# Patient Record
Sex: Female | Born: 1975 | Race: Black or African American | Hispanic: No | Marital: Single | Smoking: Never smoker
Health system: Southern US, Community
[De-identification: ages and names within clinical notes are randomized; demographics above are authoritative.]

## PROBLEM LIST (undated history)

## (undated) DIAGNOSIS — J45909 Unspecified asthma, uncomplicated: Secondary | ICD-10-CM

## (undated) DIAGNOSIS — N926 Irregular menstruation, unspecified: Secondary | ICD-10-CM

## (undated) DIAGNOSIS — D509 Iron deficiency anemia, unspecified: Secondary | ICD-10-CM

## (undated) HISTORY — PX: UTERINE FIBROID SURGERY: SHX826

---

## 2011-09-18 ENCOUNTER — Ambulatory Visit: Payer: Self-pay | Admitting: Internal Medicine

## 2012-11-23 ENCOUNTER — Ambulatory Visit: Payer: Self-pay | Admitting: Obstetrics and Gynecology

## 2014-05-30 ENCOUNTER — Emergency Department: Payer: Self-pay | Admitting: Emergency Medicine

## 2015-04-09 ENCOUNTER — Encounter: Payer: Self-pay | Admitting: Emergency Medicine

## 2015-04-09 ENCOUNTER — Emergency Department: Payer: BC Managed Care – PPO

## 2015-04-09 ENCOUNTER — Emergency Department
Admission: EM | Admit: 2015-04-09 | Discharge: 2015-04-09 | Disposition: A | Discharge status: Home / Self Care | Payer: BC Managed Care – PPO | Attending: Emergency Medicine | Admitting: Emergency Medicine

## 2015-04-09 ENCOUNTER — Other Ambulatory Visit: Payer: Self-pay

## 2015-04-09 DIAGNOSIS — M5410 Radiculopathy, site unspecified: Secondary | ICD-10-CM | POA: Diagnosis not present

## 2015-04-09 DIAGNOSIS — R202 Paresthesia of skin: Secondary | ICD-10-CM | POA: Diagnosis present

## 2015-04-09 DIAGNOSIS — Z880 Allergy status to penicillin: Secondary | ICD-10-CM | POA: Insufficient documentation

## 2015-04-09 DIAGNOSIS — R079 Chest pain, unspecified: Secondary | ICD-10-CM

## 2015-04-09 LAB — TROPONIN I

## 2015-04-09 LAB — URINALYSIS COMPLETE WITH MICROSCOPIC (ARMC ONLY)
Bilirubin Urine: NEGATIVE
Glucose, UA: NEGATIVE mg/dL
Ketones, ur: NEGATIVE mg/dL
Leukocytes, UA: NEGATIVE
NITRITE: NEGATIVE
Protein, ur: NEGATIVE mg/dL
Specific Gravity, Urine: 1.004 — ABNORMAL LOW (ref 1.005–1.030)
WBC, UA: NONE SEEN WBC/hpf (ref 0–5)
pH: 8 (ref 5.0–8.0)

## 2015-04-09 LAB — BASIC METABOLIC PANEL
ANION GAP: 6 (ref 5–15)
BUN: 8 mg/dL (ref 6–20)
CO2: 27 mmol/L (ref 22–32)
CREATININE: 0.65 mg/dL (ref 0.44–1.00)
Calcium: 9.2 mg/dL (ref 8.9–10.3)
Chloride: 105 mmol/L (ref 101–111)
GFR calc non Af Amer: >60 mL/min (ref >60)
GLUCOSE: 82 mg/dL (ref 65–99)
Potassium: 3.9 mmol/L (ref 3.5–5.1)
Sodium: 138 mmol/L (ref 135–145)

## 2015-04-09 LAB — CBC
HCT: 37.1 % (ref 35.0–47.0)
HEMOGLOBIN: 11.9 g/dL — AB (ref 12.0–16.0)
MCH: 25.6 pg — ABNORMAL LOW (ref 26.0–34.0)
MCHC: 32.1 g/dL (ref 32.0–36.0)
MCV: 79.8 fL — AB (ref 80.0–100.0)
PLATELETS: 195 10*3/uL (ref 150–440)
RBC: 4.64 MIL/uL (ref 3.80–5.20)
RDW: 14.5 % (ref 11.5–14.5)
WBC: 6 10*3/uL (ref 3.6–11.0)

## 2015-04-09 MED ORDER — DIAZEPAM 5 MG PO TABS: 5.0000 mg | ORAL_TABLET | Freq: Three times a day (TID) | ORAL | Status: DC | PRN

## 2015-04-09 NOTE — ED Notes (Signed)
Has had intermittent chest pain for 2 weeks also has had some tingling to right hand off and on.Marland Kitchen

## 2015-04-09 NOTE — Discharge Instructions (Signed)
Paresthesia Paresthesia is an abnormal burning or prickling sensation. This sensation is generally felt in the hands, arms, legs, or feet. However, it may occur in any part of the body. It is usually not painful. The feeling may be described as:  Tingling or numbness.  "Pins and needles."  Skin crawling.  Buzzing.  Limbs "falling asleep."  Itching. Most people experience temporary (transient) paresthesia at some time in their lives. CAUSES  Paresthesia may occur when you breathe too quickly (hyperventilation). It can also occur without any apparent cause. Commonly, paresthesia occurs when pressure is placed on a nerve. The feeling quickly goes away once the pressure is removed. For some people, however, paresthesia is a long-lasting (chronic) condition caused by an underlying disorder. The underlying disorder may be:  A traumatic, direct injury to nerves. Examples include a:  Broken (fractured) neck.  Fractured skull.  A disorder affecting the brain and spinal cord (central nervous system). Examples include:  Transverse myelitis.  Encephalitis.  Transient ischemic attack.  Multiple sclerosis.  Stroke.  Tumor or blood vessel problems, such as an arteriovenous malformation pressing against the brain or spinal cord.  A condition that damages the peripheral nerves (peripheral neuropathy). Peripheral nerves are not part of the brain and spinal cord. These conditions include:  Diabetes.  Peripheral vascular disease.  Nerve entrapment syndromes, such as carpal tunnel syndrome.  Shingles.  Hypothyroidism.  Vitamin B12 deficiencies.  Alcoholism.  Heavy metal poisoning (lead, arsenic).  Rheumatoid arthritis.  Systemic lupus erythematosus. DIAGNOSIS  Your caregiver will attempt to find the underlying cause of your paresthesia. Your caregiver may:  Take your medical history.  Perform a physical exam.  Order various lab tests.  Order imaging tests. TREATMENT    Treatment for paresthesia depends on the underlying cause. HOME CARE INSTRUCTIONS  Avoid drinking alcohol.  You may consider massage or acupuncture to help relieve your symptoms.  Keep all follow-up appointments as directed by your caregiver. SEEK IMMEDIATE MEDICAL CARE IF:   You feel weak.  You have trouble walking or moving.  You have problems with speech or vision.  You feel confused.  You cannot control your bladder or bowel movements.  You feel numbness after an injury.  You faint.  Your burning or prickling feeling gets worse when walking.  You have pain, cramps, or dizziness.  You develop a rash. MAKE SURE YOU:  Understand these instructions.  Will watch your condition.  Will get help right away if you are not doing well or get worse. Document Released: 10/10/2002 Document Revised: 01/12/2012 Document Reviewed: 07/11/2011 [facility] Patient Information 2015 [place], [place]. This information is not intended to replace advice given to you by your health care provider. Make sure you discuss any questions you have with your health care provider.  Chest Pain (Nonspecific) It is often hard to give a specific diagnosis for the cause of chest pain. There is always a chance that your pain could be related to something serious, such as a heart attack or a blood clot in the lungs. You need to follow up with your health care provider for further evaluation. CAUSES   Heartburn.  Pneumonia or bronchitis.  Anxiety or stress.  Inflammation around your heart (pericarditis) or lung (pleuritis or pleurisy).  A blood clot in the lung.  A collapsed lung (pneumothorax). It can develop suddenly on its own (spontaneous pneumothorax) or from trauma to the chest.  Shingles infection (herpes zoster virus). The chest wall is composed of bones, muscles, and cartilage. Any  of these can be the source of the pain.  The bones can be bruised by injury.  The muscles or cartilage can  be strained by coughing or overwork.  The cartilage can be affected by inflammation and become sore (costochondritis). DIAGNOSIS  Lab tests or other studies may be needed to find the cause of your pain. Your health care provider may have you take a test called an ambulatory electrocardiogram (ECG). An ECG records your heartbeat patterns over a 24-hour period. You may also have other tests, such as:  Transthoracic echocardiogram (TTE). During echocardiography, sound waves are used to evaluate how blood flows through your heart.  Transesophageal echocardiogram (TEE).  Cardiac monitoring. This allows your health care provider to monitor your heart rate and rhythm in real time.  Holter monitor. This is a portable device that records your heartbeat and can help diagnose heart arrhythmias. It allows your health care provider to track your heart activity for several days, if needed.  Stress tests by exercise or by giving medicine that makes the heart beat faster. TREATMENT   Treatment depends on what may be causing your chest pain. Treatment may include:  Acid blockers for heartburn.  Anti-inflammatory medicine.  Pain medicine for inflammatory conditions.  Antibiotics if an infection is present.  You may be advised to change lifestyle habits. This includes stopping smoking and avoiding alcohol, caffeine, and chocolate.  You may be advised to keep your head raised (elevated) when sleeping. This reduces the chance of acid going backward from your stomach into your esophagus. Most of the time, nonspecific chest pain will improve within 2-3 days with rest and mild pain medicine.  HOME CARE INSTRUCTIONS   If antibiotics were prescribed, take them as directed. Finish them even if you start to feel better.  For the next few days, avoid physical activities that bring on chest pain. Continue physical activities as directed.  Do not use any tobacco products, including cigarettes, chewing tobacco,  or electronic cigarettes.  Avoid drinking alcohol.  Only take medicine as directed by your health care provider.  Follow your health care provider's suggestions for further testing if your chest pain does not go away.  Keep any follow-up appointments you made. If you do not go to an appointment, you could develop lasting (chronic) problems with pain. If there is any problem keeping an appointment, call to reschedule. SEEK MEDICAL CARE IF:   Your chest pain does not go away, even after treatment.  You have a rash with blisters on your chest.  You have a fever. SEEK IMMEDIATE MEDICAL CARE IF:   You have increased chest pain or pain that spreads to your arm, neck, jaw, back, or abdomen.  You have shortness of breath.  You have an increasing cough, or you cough up blood.  You have severe back or abdominal pain.  You feel nauseous or vomit.  You have severe weakness.  You faint.  You have chills. This is an emergency. Do not wait to see if the pain will go away. Get medical help at once. Call your local emergency services (911 in [place] Do not drive yourself to the hospital. MAKE SURE YOU:   Understand these instructions.  Will watch your condition.  Will get help right away if you are not doing well or get worse. Document Released: 07/30/2005 Document Revised: 10/25/2013 Document Reviewed: 05/25/2008 [facility] Patient Information 2015 [place], [place]. This information is not intended to replace advice given to you by your health care provider. Make  sure you discuss any questions you have with your health care provider.

## 2015-04-09 NOTE — ED Provider Notes (Signed)
[facility] Emergency Department Provider Note     Time seen: ----------------------------------------- 5:59 PM on 04/09/2015 -----------------------------------------    I have reviewed the triage vital signs and the nursing notes.   HISTORY  Chief Complaint Tingling    HPI Jenna Cameron is a 39 y.o. 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 It is only in the dorsum of the hand, where the pain is located. Chest pressure may be anxiety related, states she is under a lot stress from integrated testing. Denies strong family history, pain is mild currently.    History reviewed. No pertinent past medical history.  There are no active problems to display for this patient.   History reviewed. No pertinent past surgical history.  No current outpatient prescriptions on file.  Allergies Shrimp and Penicillins  History reviewed. No pertinent family history.  Social History History  Substance Use Topics  . Smoking status: Never Smoker   . Smokeless tobacco: Not on file  . Alcohol Use: Yes     Comment: occassional    Review of Systems Constitutional: Negative for fever. Eyes: Negative for visual changes. ENT: Negative for sore throat. Cardiovascular: Positive for chest pain Respiratory: Negative for shortness of breath. Gastrointestinal: Negative for abdominal pain, vomiting and diarrhea. Genitourinary: Negative for dysuria. Musculoskeletal: Negative for back pain. Skin: Negative for rash. Neurological: Negative for headaches, paresthesias to the dorsums of both hands  10-point ROS otherwise negative.  ____________________________________________   PHYSICAL EXAM:  VITAL SIGNS: ED Triage Vitals  Enc Vitals Group     BP 04/09/15 1646 159/113 mmHg   Pulse Rate 04/09/15 1646 72     Resp 04/09/15 1646 20     Temp 04/09/15 1646 98.2 F (36.8 C)     Temp Source 04/09/15 1646 Oral     SpO2 04/09/15 1646 99 %     Weight 04/09/15 1646 204 lb (92.534 kg)     Height 04/09/15 1646 5' (1.524 m)     Head Cir --      Peak Flow --      Pain Score 04/09/15 1646 2     Pain Loc --      Pain Edu? --      Excl. in [place]? --     Constitutional: Alert and oriented. Well appearing and in no distress. Eyes: Conjunctivae are normal. PERRL. Normal extraocular movements. ENT   Head: Normocephalic and atraumatic.   Nose: No congestion/rhinnorhea.   Mouth/Throat: Mucous membranes are moist.   Neck: No stridor. Hematological/Lymphatic/Immunilogical: No cervical lymphadenopathy. Cardiovascular: Normal rate, regular rhythm. Normal and symmetric distal pulses are present in all extremities. No murmurs, rubs, or gallops. Respiratory: Normal respiratory effort without tachypnea nor retractions. Breath sounds are clear and equal bilaterally. No wheezes/rales/rhonchi. Gastrointestinal: Soft and nontender. No distention. No abdominal bruits. There is no CVA tenderness. Musculoskeletal: Nontender with normal range of motion in all extremities. No joint effusions.  No lower extremity tenderness nor edema. Neurologic:  Normal speech and language. No gross focal neurologic deficits are appreciated. Speech is normal. No gait instability. Negative for carpal tunnel bilaterally Skin:  Skin is warm, dry and intact. No rash noted. Psychiatric: Mood and affect are normal. Speech and behavior are normal. Patient exhibits appropriate insight and judgment.  ____________________________________________    LABS (pertinent positives/negatives)  Labs Reviewed  CBC - Abnormal; Notable for the following:    Hemoglobin 11.9 (*)    MCV 79.8 (*)    MCH 25.6 (*)    All other components within normal limits  URINALYSIS COMPLETEWITH MICROSCOPIC ([facility] ONLY) - Abnormal;  Notable for the following:    Color, Urine STRAW (*)    APPearance CLEAR (*)    Specific Gravity, Urine 1.004 (*)    Hgb urine dipstick 1+ (*)    Bacteria, UA RARE (*)    Squamous Epithelial / LPF 0-5 (*)    All other components within normal limits  BASIC METABOLIC PANEL  TROPONIN I    EKG: Interpreted by me. Normal sinus rhythm, with normal axis normal intervals, no evidence of hypertrophy or acute infarction. Rate is 65  ____________________________________________  ED COURSE:  Pertinent labs & imaging results that were available during my care of the patient were reviewed by me and considered in my medical decision making (see chart for details).  Check basic labs, cervical spine series ____________________________________________   RADIOLOGY  C-spine x-rays with mild degenerative changes  ____________________________________________    FINAL ASSESSMENT AND PLAN  Paresthesias and chest pain  Plan: Patient likely dealing most with anxiety related issues. We'll discharge with Valium which will help with there is muscle spasms, she is stable for outpatient follow-up with her doctor. Heart score is low risk    Earleen Newport, MD   Earleen Newport, MD 04/09/15 [phone]

## 2015-05-03 ENCOUNTER — Other Ambulatory Visit: Payer: Self-pay | Admitting: Family Medicine

## 2015-05-03 DIAGNOSIS — Z1231 Encounter for screening mammogram for malignant neoplasm of breast: Secondary | ICD-10-CM

## 2015-05-21 ENCOUNTER — Ambulatory Visit
Admission: RE | Admit: 2015-05-21 | Discharge: 2015-05-21 | Disposition: A | Discharge status: Home / Self Care | Payer: BC Managed Care – PPO | Source: Ambulatory Visit | Attending: Family Medicine | Admitting: Family Medicine

## 2015-05-21 ENCOUNTER — Encounter (INDEPENDENT_AMBULATORY_CARE_PROVIDER_SITE_OTHER): Payer: Self-pay

## 2015-05-21 DIAGNOSIS — Z1231 Encounter for screening mammogram for malignant neoplasm of breast: Secondary | ICD-10-CM | POA: Diagnosis not present

## 2015-06-13 ENCOUNTER — Encounter
Admission: RE | Admit: 2015-06-13 | Discharge: 2015-06-13 | Disposition: A | Discharge status: Home / Self Care | Payer: BC Managed Care – PPO | Source: Ambulatory Visit | Attending: Obstetrics and Gynecology | Admitting: Obstetrics and Gynecology

## 2015-06-13 DIAGNOSIS — N809 Endometriosis, unspecified: Secondary | ICD-10-CM | POA: Diagnosis present

## 2015-06-13 DIAGNOSIS — N840 Polyp of corpus uteri: Secondary | ICD-10-CM | POA: Diagnosis not present

## 2015-06-13 DIAGNOSIS — D259 Leiomyoma of uterus, unspecified: Secondary | ICD-10-CM | POA: Diagnosis not present

## 2015-06-13 DIAGNOSIS — N711 Chronic inflammatory disease of uterus: Secondary | ICD-10-CM | POA: Diagnosis not present

## 2015-06-13 DIAGNOSIS — N979 Female infertility, unspecified: Secondary | ICD-10-CM | POA: Diagnosis not present

## 2015-06-13 HISTORY — DX: Unspecified asthma, uncomplicated: J45.909

## 2015-06-13 HISTORY — DX: Iron deficiency anemia, unspecified: D50.9

## 2015-06-13 HISTORY — DX: Irregular menstruation, unspecified: N92.6

## 2015-06-13 LAB — TYPE AND SCREEN
ABO/RH(D): A POS
Antibody Screen: NEGATIVE

## 2015-06-13 LAB — ABO/RH: ABO/RH(D): A POS

## 2015-06-13 NOTE — H&P (Signed)
CC: AUB, AMA and desires fertility  Subjective:    Jenna Cameron is a 39 y.o.G0 female who presents to discuss surgical interventions for AUB.  History of AUB: Pt has a long history of AUB. In 2010, she underwent a Laparotomy and abdominal myomectomy for large uterine fibroids. Op note scanned into this system describes concurrent LOA and ablation of endometriosis and noted that the ovaries were adhesed to the sidewall. This was done at [facility] in [place], [place].   That same year she also underwent a hysteroscopy and SM fibroid resection of 2.5cm posterior fundal fibroid. A second fibroid was located over the left ostia and was partially resected. The Op note scanned into our system notes that a component was left behind given its proximity to the ostia.   Since 2012 she had improvement in her AUB. But in 2015, the menometrorrhagia returned. She had an ultrasound last month to evaluate the cavity.   [place]: Enodmetrial thickness is 11.3m. There are 3 ovarian cysts: 1. Rt cyst: 2.69 x 3.24x1.80 (simple) 2. Rt cyst 2: 1.07cmx1.28cmx1.06cm (simple) 3. Lt cyst 1: 1.73cmx1.84cmx1.56cm (complex) 4. No fibroids seen, Echogenic material in endometrium, polyp vs. Clots,  ROS: she denies dysmenorrhea, syncope, abdominal pain, abnl discharge, infections, dysuria, dyschezia  History of infertility: Pt desires conception and given her AMA, she wants to proceed with evaluation as soon as possible. She has not actively tried to conceive given her AUB and ultrasound findings.   Menstrual History: OB History    Gravida Para Term Preterm AB TAB SAB Ectopic Multiple Living   0 0 0 0 0 0 0 0 0 0      Patient's last menstrual period was 05/23/2015.   GYN: no h/o abnl paps, uptodate  Past Medical History:  has a past medical history of Allergic rhinitis; Fibroids; Anemia; Lactose intolerance; Vitamin D deficiency; Migraines; and History of chicken pox. Problem  List: has Allergic rhinitis; Anemia; Vitamin D deficiency; Migraines; Obesity due to excess calories; Fibroids; and Abnormal uterine bleeding (AUB) on her problem list. Past Surgical History:  has past surgical history that includes fibroid removal. As described above Family History: family history includes Stroke in her mother. Social History:  reports that she has never smoked. She has never used smokeless tobacco. She reports that she drinks alcohol. She reports that she does not use illicit drugs. Current Medications: has a current medication list which includes the following prescription(s): cetirizine, fluticasone, ibuprofen, and multivitamin. Prior to encounter Medications:  Current Outpatient Prescriptions on File Prior to Visit  Medication Sig Dispense Refill  . cetirizine (ZYRTEC) 10 MG tablet Take 10 mg by mouth once daily.    . fluticasone (FLONASE) 50 mcg/actuation nasal spray Place 2 sprays into both nostrils once daily.    .Marland Kitchenibuprofen (ADVIL,MOTRIN) 800 MG tablet Take 800 mg by mouth every 6 (six) hours as needed for Pain.    . multivitamin tablet Take 1 tablet by mouth once daily.     No current facility-administered medications on file prior to visit.   Allergies: is allergic to penicillins and shellfish containing products.    ROS: General: No weight loss or weight gain, fatigue or fevers. HEENT: No change in vision, double vision or loss of hearing. No nosebleeds, difficulty swallowing, sore throat or any other complaints. HEART: No CP,palpatations, swelling in the feet or legs or difficulty breathing while lying flat. LUNGS: No SOB, Cough, Congestion or wheezing. GASTROINTESTINAL: No nausea, vomiting, diarrhea, constipation, heartburn or stomach pain. GENITOURINARY:  No urgency, frequency or dysuria. MUSCULOSKELETAL: No joint pain, weakness, cramping or back pain. NEUROLOGIC: No frequent HA's, numbness, blackouts or dizziness. PSYCHIATRIC: No  anxiety, panic or depression or increased stress or suicidal ideation. ENDOCRINE: No heat or cold intolerance, excessive thirst or hunger. HEMATOLOGIC/LYMPH: No easy bruising or swollen lymph nodes.  Objective:     Vitals    Filed Vitals:   06/07/15 0926  BP: 130/90  Pulse: 72  Height: 152.4 cm (5')  Weight: 90.266 kg (199 lb)       PHYSICAL EXAM: BP 130/90 mmHg  Pulse 72  Ht 152.4 cm (5')  Wt 90.266 kg (199 lb)  BMI 38.86 kg/m2  LMP 05/23/2015 Body mass index is 38.86 kg/(m^2). General appearance: alert, cooperative, pleasant, in no acute distress Head: Normocephalic, atraumatic Eyes: conjunctiva/corneas normal, PERRL Oropharynx: moist without lesions, teeth in good repair Neck: supple and no JVD Heart: regular rate and rhythm, without murmur Lungs: clear to auscultation, without rales or wheeze, good air exchange Abdomen: soft, nondistended, nontender, no hepatosplenomegaly or masses Ext: no edema in LE bilaterally, good distal pulses  Pelvic exam: VULVA: normal appearing vulva with no masses, tenderness or lesions, VAGINA: normal appearing vagina with normal color and discharge, no lesions, CERVIX: normal appearing cervix without discharge or lesions, UTERUS: anteverted, enlarged to 12 week's size, mobile, ADNEXA: normal adnexa in size, nontender and no masses.  Pertinent labs: 09/2014 - HbA1C - 6.2 05/23/2015 - hct 33  Assessment:   39yo G0 w/ AUB likely due to SM fibroid/polyp based on sono findings and past history. Also 39yo and desiring conception.   Plan:   1. Fertility - patient's top priority at this time is conception - we thoroughly discussed the role of cavity evaluation in light of the sono results with thickened EMS. We discussed the ideal next step to be an SIS to evaluate for polyp and/or SM fibroid in terms of better surgical planning, but given the patient's age, h/o SM fibroid component left behind in 2010, and desire for immediate  fertility with sono findings c/w thickened EMS, we will pursue D&C Hysteroscopy, myosure excision of possible SM fibroid. This will also provide an endometrial sample for evaluation given her long h/o AUB. - Because of the patient's history of endometriosis and ablation of endometriomas, there is concern for tubal patency. Though the patient has not met criteria yet for an infertility work-up (has not tried to conceive for at least 6 months), given her complicated GYN history and age, she warrants an early evaluation. We discussed concurrently doing a laparoscopy/chromotubation to evaluate the tubes. - this visit was to discuss the surgical steps for immediate evaluation and management of anatomical causes of infertility. Patient will need to return to clinic to complete the medical workup including repeat HbA1C, TSH, and assessment of ovarian reserve.  - consents signed for procedure: D&C hysteroscopy, possible SM fibroid myomectomy with myosure, Diagnostic Laparoscopy with chromotubation, possible LOA and excision of endometriosis.  - R/B/A of the procedure discussed at great length - The patient and I discussed the technical aspects of the procedure including the potential for risks and complications. These include but are not limited to the risk of infection requiring post-operative antibiotics or further procedures. We talked about the risk of injury to adjacent organs including bladder, bowel, ureter, blood vessels or nerves; intrauterine scarring which may impair future fertility; need to abort the procedure if perforation and inability to safely proceed. We talked about the need to convert to an open  incision. We talked about the possible need for blood transfusion. We talked aboutpostop complications such asthromboembolic or cardiopulmonary complications. All of her questions were answered. Her preoperative exam was completed. She is scheduled to undergo this procedure in the near  future.   2. AUB in the setting of desired fertility - given the 2010 hysteroscopy report, we discussed the possibility that, if we do find a SM fibroid and attempt to resect it to achieve a favorable cavity, there may remain an intramural component to the SM fibroid that was not visualized on the sonogram. We discussed the role of MRI in evaluating the type of SM fibroids (0,1,2), but the patient expressed a desire for hysterectomy if she is unable to become pregnant and continues to have AUB.  - will f/u currettings from the [facility] to r/o dysplasia or hyperplasia  3. Lifestyle modifications - we discussed the need to continue with diet and exercise given her desire for pregnancy and her elevated hbA1c of 6.2 in November.  4. Ovarian cysts - given patient's desire for fertility, the asymptomatic nature of the cysts, and the small size of the cysts, we discussed the benefit of monitoring rather than surgical removal at the time of laparotomy. Cystectomy can cause decreases in ovarian reserve. unless there is a clinical suspicion of malignancy, we will not perform cystectomies at the time of laparoscopy.   Greater than 50% (approximately 30 minutes) of this visit was spent counseling the patient on diagnosis and management options.   Joylene Igo, MD

## 2015-06-13 NOTE — Patient Instructions (Signed)
  Your procedure is scheduled on: 06/15/15 Fri Report to Day Surgery. To find out your arrival time please call [phone] between 1PM - 3PM on 06/14/15 Thurs.  Remember: Instructions that are not followed completely may result in serious medical risk, up to and including death, or upon the discretion of your surgeon and anesthesiologist your surgery may need to be rescheduled.    _x___ 1. Do not eat food or drink liquids after midnight. No gum chewing or hard candies.     _x___ 2. No Alcohol for 24 hours before or after surgery.   ____ 3. Bring all medications with you on the day of surgery if instructed.    __x__ 4. Notify your doctor if there is any change in your medical condition     (cold, fever, infections).     Do not wear jewelry, make-up, hairpins, clips or nail polish.  Do not wear lotions, powders, or perfumes. You may wear deodorant.  Do not shave 48 hours prior to surgery. Men may shave face and neck.  Do not bring valuables to the hospital.    [facility] is not responsible for any belongings or valuables.               Contacts, dentures or bridgework may not be worn into surgery.  Leave your suitcase in the car. After surgery it may be brought to your room.  For patients admitted to the hospital, discharge time is determined by your                treatment team.   Patients discharged the day of surgery will not be allowed to drive home.   Please read over the following fact sheets that you were given:      ____ Take these medicines the morning of surgery with A SIP OF WATER:    1.   2.   3.   4.  5.  6.  ____ Fleet Enema (as directed)   ____ Use CHG Soap as directed  ____ Use inhalers on the day of surgery  ____ Stop metformin 2 days prior to surgery    ____ Take 1/2 of usual insulin dose the night before surgery and none on the morning of surgery.   ____ Stop Coumadin/Plavix/aspirin on   ____ Stop Anti-inflammatories on    ____ Stop  supplements until after surgery.    ____ Bring C-Pap to the hospital.

## 2015-06-14 NOTE — Pre-Procedure Instructions (Deleted)
Called office yesterday afternoon for the procedure so op permit could be completed.  Called office yesterday morning for pre-op orders, orders obtained after lunch.m Blood work to be done day of surgery.

## 2015-06-15 ENCOUNTER — Encounter
Admission: RE | Disposition: A | Discharge status: Home / Self Care | Payer: Self-pay | Source: Ambulatory Visit | Attending: Obstetrics and Gynecology

## 2015-06-15 ENCOUNTER — Encounter: Payer: Self-pay | Admitting: *Deleted

## 2015-06-15 ENCOUNTER — Ambulatory Visit: Payer: BC Managed Care – PPO | Admitting: Anesthesiology

## 2015-06-15 ENCOUNTER — Ambulatory Visit
Admission: RE | Admit: 2015-06-15 | Discharge: 2015-06-15 | Disposition: A | Discharge status: Home / Self Care | Payer: BC Managed Care – PPO | Source: Ambulatory Visit | Attending: Obstetrics and Gynecology | Admitting: Obstetrics and Gynecology

## 2015-06-15 DIAGNOSIS — N840 Polyp of corpus uteri: Secondary | ICD-10-CM | POA: Insufficient documentation

## 2015-06-15 DIAGNOSIS — N711 Chronic inflammatory disease of uterus: Secondary | ICD-10-CM | POA: Insufficient documentation

## 2015-06-15 DIAGNOSIS — N979 Female infertility, unspecified: Secondary | ICD-10-CM | POA: Insufficient documentation

## 2015-06-15 DIAGNOSIS — D259 Leiomyoma of uterus, unspecified: Secondary | ICD-10-CM | POA: Insufficient documentation

## 2015-06-15 HISTORY — PX: DILATATION & CURETTAGE/HYSTEROSCOPY WITH MYOSURE: SHX6511

## 2015-06-15 HISTORY — PX: LAPAROSCOPY: SHX197

## 2015-06-15 HISTORY — PX: CHROMOPERTUBATION: SHX6288

## 2015-06-15 LAB — CBC
HCT: 32 % — ABNORMAL LOW (ref 35.0–47.0)
Hemoglobin: 10.2 g/dL — ABNORMAL LOW (ref 12.0–16.0)
MCH: 24.7 pg — ABNORMAL LOW (ref 26.0–34.0)
MCHC: 32 g/dL (ref 32.0–36.0)
MCV: 77.3 fL — ABNORMAL LOW (ref 80.0–100.0)
Platelets: 221 10*3/uL (ref 150–440)
RBC: 4.14 MIL/uL (ref 3.80–5.20)
RDW: 14.8 % — ABNORMAL HIGH (ref 11.5–14.5)
WBC: 5.1 10*3/uL (ref 3.6–11.0)

## 2015-06-15 LAB — POCT PREGNANCY, URINE: Preg Test, Ur: NEGATIVE

## 2015-06-15 SURGERY — DILATATION & CURETTAGE/HYSTEROSCOPY WITH MYOSURE
Anesthesia: General | Site: Cervix | Wound class: Clean Contaminated

## 2015-06-15 MED ORDER — FAMOTIDINE 20 MG PO TABS
ORAL_TABLET | ORAL | Status: AC
  Administered 2015-06-15: 20 mg via ORAL
  Filled 2015-06-15: qty 1

## 2015-06-15 MED ORDER — EPHEDRINE SULFATE 50 MG/ML IJ SOLN
INTRAMUSCULAR | Status: DC | PRN
  Administered 2015-06-15: 10 mg via INTRAVENOUS

## 2015-06-15 MED ORDER — METHYLENE BLUE 1 % INJ SOLN
INTRAMUSCULAR | Status: AC
  Filled 2015-06-15: qty 10

## 2015-06-15 MED ORDER — NEOSTIGMINE METHYLSULFATE 10 MG/10ML IV SOLN
INTRAVENOUS | Status: DC | PRN
  Administered 2015-06-15: 4 mg via INTRAVENOUS

## 2015-06-15 MED ORDER — LACTATED RINGERS IV SOLN
INTRAVENOUS | Status: DC
  Administered 2015-06-15 (×2): via INTRAVENOUS

## 2015-06-15 MED ORDER — OXYCODONE HCL 5 MG PO TABS
5.0000 mg | ORAL_TABLET | ORAL | Status: DC | PRN
  Administered 2015-06-15: 5 mg via ORAL

## 2015-06-15 MED ORDER — DIPHENHYDRAMINE HCL 50 MG/ML IJ SOLN
12.5000 mg | Freq: Four times a day (QID) | INTRAMUSCULAR | Status: DC | PRN
Start: 2015-06-15 — End: 2015-06-15

## 2015-06-15 MED ORDER — GLYCOPYRROLATE 0.2 MG/ML IJ SOLN
INTRAMUSCULAR | Status: DC | PRN
  Administered 2015-06-15: 0.6 mg via INTRAVENOUS

## 2015-06-15 MED ORDER — BUPIVACAINE HCL (PF) 0.25 % IJ SOLN
INTRAMUSCULAR | Status: DC | PRN
  Administered 2015-06-15: 5 mL

## 2015-06-15 MED ORDER — FENTANYL CITRATE (PF) 100 MCG/2ML IJ SOLN
INTRAMUSCULAR | Status: AC
  Administered 2015-06-15: 25 ug via INTRAVENOUS
  Filled 2015-06-15: qty 2

## 2015-06-15 MED ORDER — FENTANYL CITRATE (PF) 100 MCG/2ML IJ SOLN
25.0000 ug | INTRAMUSCULAR | Status: DC | PRN
Start: 2015-06-15 — End: 2015-06-15
  Administered 2015-06-15 (×4): 25 ug via INTRAVENOUS

## 2015-06-15 MED ORDER — PROPOFOL 10 MG/ML IV BOLUS
INTRAVENOUS | Status: DC | PRN
  Administered 2015-06-15: 180 mg via INTRAVENOUS

## 2015-06-15 MED ORDER — ACETAMINOPHEN 650 MG RE SUPP: 650.0000 mg | Freq: Four times a day (QID) | RECTAL | Status: DC | PRN

## 2015-06-15 MED ORDER — OXYCODONE HCL 5 MG PO TABS
ORAL_TABLET | ORAL | Status: AC
  Filled 2015-06-15: qty 1

## 2015-06-15 MED ORDER — DEXAMETHASONE SODIUM PHOSPHATE 4 MG/ML IJ SOLN
INTRAMUSCULAR | Status: DC | PRN
  Administered 2015-06-15: 5 mg via INTRAVENOUS

## 2015-06-15 MED ORDER — LIDOCAINE HCL (CARDIAC) 20 MG/ML IV SOLN
INTRAVENOUS | Status: DC | PRN
  Administered 2015-06-15: 100 mg via INTRAVENOUS

## 2015-06-15 MED ORDER — OXYCODONE-ACETAMINOPHEN 10-325 MG PO TABS: 1.0000 | ORAL_TABLET | ORAL | Status: DC | PRN

## 2015-06-15 MED ORDER — DIPHENHYDRAMINE HCL 12.5 MG/5ML PO ELIX: 12.5000 mg | ORAL_SOLUTION | Freq: Four times a day (QID) | ORAL | Status: DC | PRN

## 2015-06-15 MED ORDER — FAMOTIDINE 20 MG PO TABS
20.0000 mg | ORAL_TABLET | Freq: Once | ORAL | Status: AC
  Administered 2015-06-15: 20 mg via ORAL

## 2015-06-15 MED ORDER — ONDANSETRON 4 MG PO TBDP: 4.0000 mg | ORAL_TABLET | Freq: Four times a day (QID) | ORAL | Status: DC | PRN

## 2015-06-15 MED ORDER — OXYCODONE HCL 5 MG/5ML PO SOLN: 5.0000 mg | Freq: Once | ORAL | Status: AC | PRN

## 2015-06-15 MED ORDER — MIDAZOLAM HCL 2 MG/2ML IJ SOLN
INTRAMUSCULAR | Status: DC | PRN
  Administered 2015-06-15: 2 mg via INTRAVENOUS

## 2015-06-15 MED ORDER — ONDANSETRON HCL 4 MG/2ML IJ SOLN: 4.0000 mg | Freq: Four times a day (QID) | INTRAMUSCULAR | Status: DC | PRN

## 2015-06-15 MED ORDER — ACETAMINOPHEN 325 MG PO TABS: 650.0000 mg | ORAL_TABLET | Freq: Four times a day (QID) | ORAL | Status: DC | PRN

## 2015-06-15 MED ORDER — SUCCINYLCHOLINE CHLORIDE 20 MG/ML IJ SOLN
INTRAMUSCULAR | Status: DC | PRN
  Administered 2015-06-15: 100 mg via INTRAVENOUS

## 2015-06-15 MED ORDER — ONDANSETRON HCL 4 MG/2ML IJ SOLN
INTRAMUSCULAR | Status: DC | PRN
  Administered 2015-06-15: 4 mg via INTRAVENOUS

## 2015-06-15 MED ORDER — METHYLENE BLUE 1 % INJ SOLN
INTRAMUSCULAR | Status: DC | PRN
  Administered 2015-06-15: 100 mL via SUBMUCOSAL

## 2015-06-15 MED ORDER — OXYCODONE HCL 5 MG PO TABS
ORAL_TABLET | ORAL | Status: AC
  Administered 2015-06-15: 5 mg via ORAL
  Filled 2015-06-15: qty 1

## 2015-06-15 MED ORDER — KETOROLAC TROMETHAMINE 15 MG/ML IJ SOLN: 15.0000 mg | Freq: Four times a day (QID) | INTRAMUSCULAR | Status: DC

## 2015-06-15 MED ORDER — DOXYCYCLINE HYCLATE 50 MG PO CAPS: 100.0000 mg | ORAL_CAPSULE | Freq: Two times a day (BID) | ORAL | Status: AC

## 2015-06-15 MED ORDER — ROCURONIUM BROMIDE 100 MG/10ML IV SOLN
INTRAVENOUS | Status: DC | PRN
  Administered 2015-06-15 (×2): 10 mg via INTRAVENOUS
  Administered 2015-06-15: 30 mg via INTRAVENOUS

## 2015-06-15 MED ORDER — FENTANYL CITRATE (PF) 100 MCG/2ML IJ SOLN
INTRAMUSCULAR | Status: DC | PRN
  Administered 2015-06-15: 100 ug via INTRAVENOUS
  Administered 2015-06-15 (×2): 50 ug via INTRAVENOUS

## 2015-06-15 MED ORDER — BUPIVACAINE HCL (PF) 0.25 % IJ SOLN
INTRAMUSCULAR | Status: AC
  Filled 2015-06-15: qty 30

## 2015-06-15 MED ORDER — KETOROLAC TROMETHAMINE 15 MG/ML IJ SOLN: 15.0000 mg | Freq: Four times a day (QID) | INTRAMUSCULAR | Status: DC | PRN

## 2015-06-15 MED ORDER — DOXYCYCLINE HYCLATE 100 MG PO TABS: 100.0000 mg | ORAL_TABLET | Freq: Two times a day (BID) | ORAL | Status: DC

## 2015-06-15 MED ORDER — LACTATED RINGERS IV SOLN: INTRAVENOUS | Status: DC

## 2015-06-15 MED ORDER — OXYCODONE HCL 5 MG PO TABS
5.0000 mg | ORAL_TABLET | Freq: Once | ORAL | Status: AC | PRN
  Administered 2015-06-15: 5 mg via ORAL

## 2015-06-15 MED ORDER — BUPIVACAINE HCL (PF) 0.5 % IJ SOLN
INTRAMUSCULAR | Status: AC
  Filled 2015-06-15: qty 30

## 2015-06-15 SURGICAL SUPPLY — 48 items
BAG URO DRAIN 2000ML W/SPOUT (MISCELLANEOUS) ×3 IMPLANT
BLADE SURG SZ11 CARB STEEL (BLADE) ×3 IMPLANT
CANISTER SUC SOCK COL 7IN (MISCELLANEOUS) ×3 IMPLANT
CANISTER SUCT 3000ML (MISCELLANEOUS) ×3 IMPLANT
CATH FOLEY 2WAY  5CC 16FR (CATHETERS) ×2
CATH ROBINSON RED A/P 16FR (CATHETERS) ×3 IMPLANT
CATH URTH 16FR FL 2W BLN LF (CATHETERS) ×1 IMPLANT
CHLORAPREP W/TINT 26ML (MISCELLANEOUS) ×3 IMPLANT
CLOSURE WOUND 1/2 X4 (GAUZE/BANDAGES/DRESSINGS) ×1
CLOSURE WOUND 1/4X4 (GAUZE/BANDAGES/DRESSINGS) ×1
DRSG TEGADERM 2-3/8X2-3/4 SM (GAUZE/BANDAGES/DRESSINGS) ×3 IMPLANT
ENDOPOUCH RETRIEVER 10 (MISCELLANEOUS) IMPLANT
GAUZE SPONGE NON-WVN 2X2 STRL (MISCELLANEOUS) ×1 IMPLANT
GLOVE BIO SURGEON STRL SZ 6.5 (GLOVE) ×16 IMPLANT
GLOVE BIO SURGEONS STRL SZ 6.5 (GLOVE) ×8
GLOVE INDICATOR 7.0 STRL GRN (GLOVE) IMPLANT
GOWN STRL REUS W/ TWL LRG LVL3 (GOWN DISPOSABLE) ×3 IMPLANT
GOWN STRL REUS W/TWL LRG LVL3 (GOWN DISPOSABLE) ×6
IRRIGATION STRYKERFLOW (MISCELLANEOUS) ×1 IMPLANT
IRRIGATOR STRYKERFLOW (MISCELLANEOUS) ×3
KIT RM TURNOVER CYSTO AR (KITS) ×3 IMPLANT
LABEL OR SOLS (LABEL) ×3 IMPLANT
MANIPULATOR UTERINE ZUMI 4.5 (MISCELLANEOUS) ×3 IMPLANT
MYOSURE LITE POLYP REMOVAL (MISCELLANEOUS) ×3 IMPLANT
NS IRRIG 500ML POUR BTL (IV SOLUTION) ×3 IMPLANT
PACK DNC HYST (MISCELLANEOUS) ×3 IMPLANT
PACK GYN LAPAROSCOPIC (MISCELLANEOUS) ×3 IMPLANT
PAD OB MATERNITY 4.3X12.25 (PERSONAL CARE ITEMS) ×3 IMPLANT
PAD PREP 24X41 OB/GYN DISP (PERSONAL CARE ITEMS) ×3 IMPLANT
SHEARS HARMONIC ACE PLUS 36CM (ENDOMECHANICALS) IMPLANT
SLEEVE ENDOPATH XCEL 5M (ENDOMECHANICALS) ×3 IMPLANT
SOL .9 NS 3000ML IRR  AL (IV SOLUTION)
SOL .9 NS 3000ML IRR UROMATIC (IV SOLUTION) IMPLANT
SPONGE VERSALON 2X2 STRL (MISCELLANEOUS) ×2
SPONGE XRAY 4X4 16PLY STRL (MISCELLANEOUS) ×3 IMPLANT
STRIP CLOSURE SKIN 1/2X4 (GAUZE/BANDAGES/DRESSINGS) ×2 IMPLANT
STRIP CLOSURE SKIN 1/4X4 (GAUZE/BANDAGES/DRESSINGS) ×2 IMPLANT
SUT VIC AB 2-0 UR6 27 (SUTURE) ×3 IMPLANT
SUT VIC AB 4-0 SH 27 (SUTURE) ×2
SUT VIC AB 4-0 SH 27XANBCTRL (SUTURE) ×1 IMPLANT
SWABSTK COMLB BENZOIN TINCTURE (MISCELLANEOUS) ×6 IMPLANT
TROCAR ENDO BLADELESS 11MM (ENDOMECHANICALS) ×3 IMPLANT
TROCAR XCEL NON-BLD 5MMX100MML (ENDOMECHANICALS) ×3 IMPLANT
TROCAR XCEL UNIV SLVE 11M 100M (ENDOMECHANICALS) ×3 IMPLANT
TUBING CONNECTING 10 (TUBING) ×2 IMPLANT
TUBING CONNECTING 10' (TUBING) ×1
TUBING HYSTEROSCOPY DOLPHIN (MISCELLANEOUS) IMPLANT
TUBING INSUFFLATOR HI FLOW (MISCELLANEOUS) ×6 IMPLANT

## 2015-06-15 NOTE — Anesthesia Procedure Notes (Signed)
Procedure Name: Intubation Date/Time: 06/15/2015 9:06 AM Performed by: Aline Brochure Pre-anesthesia Checklist: Patient identified, Emergency Drugs available, Suction available and Patient being monitored Patient Re-evaluated:Patient Re-evaluated prior to inductionOxygen Delivery Method: Circle system utilized Intubation Type: IV induction Ventilation: Mask ventilation without difficulty Laryngoscope Size: Mac and 3 Grade View: Grade II Tube type: Oral Tube size: 7.0 mm Number of attempts: 1 Airway Equipment and Method: Patient positioned with wedge pillow and Stylet Placement Confirmation: ETT inserted through vocal cords under direct vision,  positive ETCO2 and breath sounds checked- equal and bilateral Secured at: 22 cm Tube secured with: Tape Dental Injury: Teeth and Oropharynx as per pre-operative assessment

## 2015-06-15 NOTE — Anesthesia Preprocedure Evaluation (Signed)
Anesthesia Evaluation  Patient identified by MRN, date of birth, ID band Patient awake    Reviewed: Allergy & Precautions, H&P , NPO status , Patient's Chart, lab work & pertinent test results  History of Anesthesia Complications Negative for: history of anesthetic complications  Airway Mallampati: III  TM Distance: >3 FB Neck ROM: full    Dental  (+) Poor Dentition   Pulmonary neg shortness of breath, asthma ,  breath sounds clear to auscultation  Pulmonary exam normal       Cardiovascular Exercise Tolerance: Good negative cardio ROS Normal cardiovascular examRhythm:regular Rate:Normal     Neuro/Psych negative neurological ROS  negative psych ROS   GI/Hepatic negative GI ROS, Neg liver ROS,   Endo/Other  negative endocrine ROS  Renal/GU negative Renal ROS  negative genitourinary   Musculoskeletal negative musculoskeletal ROS (+)   Abdominal   Peds negative pediatric ROS (+)  Hematology negative hematology ROS (+)   Anesthesia Other Findings Past Medical History:   Iron (Fe) deficiency anemia                                  Irregular menses                                             Asthma                                                         Comment:in highschool  Obesity BMI 38  Reproductive/Obstetrics negative OB ROS                             Anesthesia Physical Anesthesia Plan  ASA: III  Anesthesia Plan: General LMA   Post-op Pain Management:    Induction:   Airway Management Planned:   Additional Equipment:   Intra-op Plan:   Post-operative Plan:   Informed Consent: I have reviewed the patients History and Physical, chart, labs and discussed the procedure including the risks, benefits and alternatives for the proposed anesthesia with the patient or authorized representative who has indicated his/her understanding and acceptance.   Dental Advisory  Given  Plan Discussed with: Anesthesiologist, CRNA and Surgeon  Anesthesia Plan Comments:         Anesthesia Quick Evaluation

## 2015-06-15 NOTE — OR Nursing (Signed)
Patient has small incision over pubis that is covered with steri strips.  Slight bloody drainage noted.

## 2015-06-15 NOTE — Discharge Instructions (Addendum)
Nothing in the vagina for 2 weeks. No baths, no sex, no swimming.  No heavy lifting more than 10lbs for 2-4 weeks AMBULATORY SURGERY  DISCHARGE INSTRUCTIONS   1) The drugs that you were given will stay in your system until tomorrow so for the next 24 hours you should not:  A) Drive an automobile B) Make any legal decisions C) Drink any alcoholic beverage   2) You may resume regular meals tomorrow.  Today it is better to start with liquids and gradually work up to solid foods.  You may eat anything you prefer, but it is better to start with liquids, then soup and crackers, and gradually work up to solid foods.   3) Please notify your doctor immediately if you have any unusual bleeding, trouble breathing, redness and pain at the surgery site, drainage, fever, or pain not relieved by medication.    4) Additional Instructions: Call if you saturate more than a pad an hour with bleeding  Please contact your physician with any problems or Same Day Surgery at [phone], Monday through Friday 6 am to 4 pm, or [place] at [facility] number at [phone].

## 2015-06-15 NOTE — Transfer of Care (Signed)
Immediate Anesthesia Transfer of Care Note  Patient: Jenna Cameron  Procedure(s) Performed: Procedure(s): DILATATION & CURETTAGE/HYSTEROSCOPY WITH MYOSURE (N/A) LAPAROSCOPY DIAGNOSTIC (N/A) CHROMOPERTUBATION (N/A)  Patient Location: PACU  Anesthesia Type:General  Level of Consciousness: sedated  Airway & Oxygen Therapy: Patient Spontanous Breathing and Patient connected to face mask oxygen  Post-op Assessment: Report given to RN and Post -op Vital signs reviewed and stable  Post vital signs: Reviewed and stable  Last Vitals:  Filed Vitals:   06/15/15 1120  BP:   Pulse:   Temp: 36.3 C  Resp:   BP: 103/91 TDVVO:16 WVPX:10  Complications: No apparent anesthesia complications

## 2015-06-15 NOTE — Anesthesia Postprocedure Evaluation (Signed)
  Anesthesia Post-op Note  Patient: Jenna Cameron  Procedure(s) Performed: Procedure(s): DILATATION & CURETTAGE/HYSTEROSCOPY WITH MYOSURE (N/A) LAPAROSCOPY DIAGNOSTIC (N/A) CHROMOPERTUBATION (N/A)  Anesthesia type:General  Patient location: PACU  Post pain: Pain level controlled  Post assessment: Post-op Vital signs reviewed, Patient's Cardiovascular Status Stable, Respiratory Function Stable, Patent Airway and No signs of Nausea or vomiting  Post vital signs: Reviewed and stable  Last Vitals:  Filed Vitals:   06/15/15 1235  BP: 121/60  Pulse:   Temp: 36.3 C  Resp: 14    Level of consciousness: awake, alert  and patient cooperative  Complications: No apparent anesthesia complications

## 2015-06-15 NOTE — Interval H&P Note (Signed)
History and Physical Interval Note:  06/15/2015 8:11 AM  Jenna Cameron  has presented today for surgery, with the diagnosis of FIBROID, ENDOMETRIOSIS, INFERTITLITY  The various methods of treatment have been discussed with the patient and family. After consideration of risks, benefits and other options for treatment, the patient has consented to  Procedure(s): DILATATION & CURETTAGE/HYSTEROSCOPY WITH MYOSURE (N/A) LAPAROSCOPY DIAGNOSTIC (N/A) CHROMOPERTUBATION (N/A) as a surgical intervention .  The patient's history has been reviewed, patient examined, no change in status, stable for surgery.  I have reviewed the patient's chart and labs.  Questions were answered to the patient's satisfaction.     [place]

## 2015-06-15 NOTE — Op Note (Signed)
Estrella Deeds PROCEDURE DATE: 06/15/2015  PREOPERATIVE DIAGNOSIS: AUB, SM fibroid, concern for tubal blockage due to history of endometriosis POSTOPERATIVE DIAGNOSIS: Endometrial polyp, patent tubes PROCEDURE: D&C Hysteroscopy, Polypectomy, Diagnostic laparoscopy, Chromotubation SURGEON:  Dr. Joylene Igo ASSISTANT: Dr. Benjaman Kindler  INDICATIONS: 39 y.o. G0 with AUB and endometriosis desiring conception.   Please see preoperative notes for further details.  Risks of surgery were discussed with the patient including but not limited to: bleeding which may require transfusion or reoperation; infection which may require antibiotics; injury to bowel, bladder, ureters or other surrounding organs; need for additional procedures including laparotomy; thromboembolic phenomenon, incisional problems and other postoperative/anesthesia complications. Written informed consent was obtained.    FINDINGS:  3 Polyps in uterine cavity, bilateral 1-2cm ovarian cysts, patent fallopian tubes bilaterally.   No other abdominal/pelvic abnormality.  Normal upper abdomen.  ANESTHESIA:    General INTRAVENOUS FLUIDS: 1600 ml ESTIMATED BLOOD LOSS: 25 ml URINE OUTPUT: 1100 ml SPECIMENS: endometrial polyp and currettings COMPLICATIONS: None immediate  PROCEDURE IN DETAIL:  The patient had sequential compression devices applied to her lower extremities while in the preoperative area.  She was then taken to the operating room where general anesthesia was administered and was found to be adequate. A formal time out was performed.  She was placed in the dorsal lithotomy position, and was prepped and draped in a sterile manner.  A Foley catheter was inserted into her bladder and attached to constant drainage.  A speculum was placed in the patient's vagina and a single tooth tenaculum was applied to the anterior lip of the cervix.  Her uterus sounded to 8cm. Her cervix was serially dilated to 15 [place] using Hanks  dilators.The hysteroscope was introduced to reveal three polyps, one at the fundus, one on the anterior fundus above the right ostium and one along the left wall proximal to the left ostium. Both right and left ostiums were visualized. The myosure was introduced to remove the anterior right fundal polyp, but due to poor visualization, it was removed and the polype was removed with polyp forceps and gently curettage. The hysteroscopy was re-introduced to reveal a normal cavity without defects. The hysteroscope was then removed. Fluid deficit was minimal. Normal saline was used for fluid management.  A ZUMI uterine manipulator was then placed in the uterus.  The tenaculum was removed from the anterior lip of the cervix and the vaginal speculum was removed after applying silver nitrate for good hemostasis. The patient tolerated the procedure well and was taken to the recovery area awake and in stable condition. She received iv acetaminophen and Toradol prior to leaving the OR.  Attention was then turned to the abdomen where a 25mm umbilical incision was made with an 11-blade scalpel at the base of the umbilical fold.  The Veress needle was then carefully introduced into the peritoneal cavity at a 45 degree angle while tenting the abdominal wall. Intraperitoneal placement was confirmed using a water filled syringe to perform a saline drop test. Opening pressure was 11mm HG. Pneumoperitoneum was obtained with 4-5L of CO2 to a pressure of 15 mmHg. A 47mm blunt trocar and laparoscopic camera were then advanced without difficulty under direct visualization into the abdomen. The patient was placed in Trendelenberg.    A detailed survey of the patient's pelvis and abdomen revealed the minimal adhesions along the anterior uterus and bilateral 1-2cm cysts on her ovaries. 50cc of methylene blue was then injected into the ZUMI until spill was observed from both tubes.  A second 9mm port was then inserted at the site of her  pfannenseil incision and suction was introduced. All fluids were removed.  No intraoperative injury to surrounding organs was noted.  The abdomen was desufflated and all instruments were then removed from the patient's abdomen. The uterine manipulator and tenaculum were removed without complications.    All incisions were closed with 4-0 monofilament. The patient tolerated the procedures well.  All instruments, needles, and sponge counts were correct x 2. The patient tolerated the procedure well and was taken to the recovery area awake and in stable condition.  The patient will be discharged to home as per PACU criteria. Routine postoperative instructions given. She was prescribed percocet as needed for pain and doxycyline for 5 days given chromotubation. She will follow up in the clinic in two weeks for postoperative evaluation.  Lorette Ang, MD

## 2015-06-18 LAB — SURGICAL PATHOLOGY

## 2018-10-13 ENCOUNTER — Emergency Department
Admission: EM | Admit: 2018-10-13 | Discharge: 2018-10-13 | Disposition: A | Discharge status: Home / Self Care | Payer: BC Managed Care – PPO | Attending: Emergency Medicine | Admitting: Emergency Medicine

## 2018-10-13 ENCOUNTER — Emergency Department: Payer: BC Managed Care – PPO

## 2018-10-13 ENCOUNTER — Other Ambulatory Visit: Payer: Self-pay

## 2018-10-13 ENCOUNTER — Encounter: Payer: Self-pay | Admitting: Nurse Practitioner

## 2018-10-13 DIAGNOSIS — R202 Paresthesia of skin: Secondary | ICD-10-CM | POA: Diagnosis not present

## 2018-10-13 DIAGNOSIS — R200 Anesthesia of skin: Secondary | ICD-10-CM | POA: Insufficient documentation

## 2018-10-13 DIAGNOSIS — J45909 Unspecified asthma, uncomplicated: Secondary | ICD-10-CM | POA: Insufficient documentation

## 2018-10-13 DIAGNOSIS — Z79899 Other long term (current) drug therapy: Secondary | ICD-10-CM | POA: Insufficient documentation

## 2018-10-13 DIAGNOSIS — R51 Headache: Secondary | ICD-10-CM | POA: Diagnosis present

## 2018-10-13 DIAGNOSIS — R519 Headache, unspecified: Secondary | ICD-10-CM

## 2018-10-13 LAB — DIFFERENTIAL
Abs Immature Granulocytes: 0.02 10*3/uL (ref 0.00–0.07)
Basophils Absolute: 0 10*3/uL (ref 0.0–0.1)
Basophils Relative: 1 %
Eosinophils Absolute: 0.1 10*3/uL (ref 0.0–0.5)
Eosinophils Relative: 1 %
IMMATURE GRANULOCYTES: 0 %
LYMPHS PCT: 25 %
Lymphs Abs: 1.2 10*3/uL (ref 0.7–4.0)
Monocytes Absolute: 0.3 10*3/uL (ref 0.1–1.0)
Monocytes Relative: 7 %
NEUTROS ABS: 3.2 10*3/uL (ref 1.7–7.7)
Neutrophils Relative %: 66 %

## 2018-10-13 LAB — CBC
HCT: 40.5 % (ref 36.0–46.0)
Hemoglobin: 12.9 g/dL (ref 12.0–15.0)
MCH: 26.3 pg (ref 26.0–34.0)
MCHC: 31.9 g/dL (ref 30.0–36.0)
MCV: 82.7 fL (ref 80.0–100.0)
PLATELETS: 254 10*3/uL (ref 150–400)
RBC: 4.9 MIL/uL (ref 3.87–5.11)
RDW: 13.9 % (ref 11.5–15.5)
WBC: 4.9 10*3/uL (ref 4.0–10.5)
nRBC: 0 % (ref 0.0–0.2)

## 2018-10-13 LAB — COMPREHENSIVE METABOLIC PANEL
ALT: 22 U/L (ref 0–44)
AST: 34 U/L (ref 15–41)
Albumin: 4.4 g/dL (ref 3.5–5.0)
Alkaline Phosphatase: 56 U/L (ref 38–126)
Anion gap: 8 (ref 5–15)
BUN: 11 mg/dL (ref 6–20)
CO2: 25 mmol/L (ref 22–32)
Calcium: 9.3 mg/dL (ref 8.9–10.3)
Chloride: 106 mmol/L (ref 98–111)
Creatinine, Ser: 0.74 mg/dL (ref 0.44–1.00)
GFR calc non Af Amer: >60 mL/min (ref >60)
GLUCOSE: 114 mg/dL — AB (ref 70–99)
Potassium: 4.4 mmol/L (ref 3.5–5.1)
Sodium: 139 mmol/L (ref 135–145)
Total Bilirubin: 0.9 mg/dL (ref 0.3–1.2)
Total Protein: 7.7 g/dL (ref 6.5–8.1)

## 2018-10-13 LAB — TROPONIN I: Troponin I: <0.03 ng/mL (ref <0.03)

## 2018-10-13 LAB — PROTIME-INR
INR: 0.96
Prothrombin Time: 12.7 seconds (ref 11.4–15.2)

## 2018-10-13 LAB — APTT: aPTT: 39 seconds — ABNORMAL HIGH (ref 24–36)

## 2018-10-13 LAB — GLUCOSE, CAPILLARY: Glucose-Capillary: 108 mg/dL — ABNORMAL HIGH (ref 70–99)

## 2018-10-13 MED ORDER — KETOROLAC TROMETHAMINE 30 MG/ML IJ SOLN
30.0000 mg | Freq: Once | INTRAMUSCULAR | Status: AC
  Administered 2018-10-13: 30 mg via INTRAVENOUS
  Filled 2018-10-13: qty 1

## 2018-10-13 MED ORDER — PROMETHAZINE HCL 25 MG/ML IJ SOLN
12.5000 mg | Freq: Once | INTRAMUSCULAR | Status: AC
  Administered 2018-10-13: 12.5 mg via INTRAVENOUS

## 2018-10-13 MED ORDER — LORAZEPAM 2 MG/ML IJ SOLN: 1.0000 mg | Freq: Once | INTRAMUSCULAR | Status: DC

## 2018-10-13 MED ORDER — PROMETHAZINE HCL 25 MG/ML IJ SOLN
INTRAMUSCULAR | Status: AC
  Filled 2018-10-13: qty 1

## 2018-10-13 MED ORDER — DEXAMETHASONE SODIUM PHOSPHATE 10 MG/ML IJ SOLN
4.0000 mg | Freq: Once | INTRAMUSCULAR | Status: AC
  Administered 2018-10-13: 4 mg via INTRAVENOUS
  Filled 2018-10-13: qty 1

## 2018-10-13 MED ORDER — DIPHENHYDRAMINE HCL 50 MG/ML IJ SOLN
25.0000 mg | Freq: Once | INTRAMUSCULAR | Status: AC
  Administered 2018-10-13: 25 mg via INTRAVENOUS
  Filled 2018-10-13: qty 1

## 2018-10-13 MED ORDER — SODIUM CHLORIDE 0.9 % IV BOLUS
1000.0000 mL | Freq: Once | INTRAVENOUS | Status: AC
  Administered 2018-10-13: 1000 mL via INTRAVENOUS

## 2018-10-13 MED ORDER — METOCLOPRAMIDE HCL 5 MG/ML IJ SOLN
10.0000 mg | Freq: Once | INTRAMUSCULAR | Status: AC
  Administered 2018-10-13: 10 mg via INTRAVENOUS
  Filled 2018-10-13: qty 2

## 2018-10-13 MED ORDER — SUMATRIPTAN SUCCINATE 50 MG PO TABS: 50.0000 mg | ORAL_TABLET | ORAL | 0 refills | Status: DC | PRN

## 2018-10-13 NOTE — Code Documentation (Signed)
Pt arrives with complaints of severe headache on and off for a few weeks, states she was her PCP this AM for the headache when she developed right arm numbness, pt brought to ED and code stroke activated, Dr. Darl Householder cleared pt for CT at [place], after Ct pt taken to room 11, Dr. Doy Mince at bedside, initial NIHSS 0 with improved numbness, pt states hx of migraines with no numbness, no tPA given due to improving symptoms, report off to [employer]

## 2018-10-13 NOTE — ED Provider Notes (Signed)
[facility] EMERGENCY DEPARTMENT Provider Note   CSN: [id number] Arrival date & time: 10/13/18  1660     History   Chief Complaint Chief Complaint  Patient presents with  . Headache  . Tingling    HPI Jenna Cameron is a 42 y.o. female history of asthma, headaches here presenting with headache, right arm numbness.  Patient states that she has intermittent headaches for the last 3 to 4 days.  She woke around 4:30 AM and had worsening headaches.  Around 8:15 AM started having numbness on the right arm associated with worsening headaches.  Denies any trouble speaking or history of strokes.  Patient states that she does have a history of migraines.  The history is provided by the patient.    Past Medical History:  Diagnosis Date  . Asthma    in highschool  . Iron (Fe) deficiency anemia   . Irregular menses     There are no active problems to display for this patient.   Past Surgical History:  Procedure Laterality Date  . CHROMOPERTUBATION N/A 06/15/2015   Procedure: CHROMOPERTUBATION;  Surgeon: Lorette Ang, MD;  Location: [facility] ORS;  Service: Gynecology;  Laterality: N/A;  . DILATATION & CURETTAGE/HYSTEROSCOPY WITH MYOSURE N/A 06/15/2015   Procedure: DILATATION & CURETTAGE/HYSTEROSCOPY WITH MYOSURE;  Surgeon: Lorette Ang, MD;  Location: [facility] ORS;  Service: Gynecology;  Laterality: N/A;  . LAPAROSCOPY N/A 06/15/2015   Procedure: LAPAROSCOPY DIAGNOSTIC;  Surgeon: Lorette Ang, MD;  Location: [facility] ORS;  Service: Gynecology;  Laterality: N/A;  . UTERINE FIBROID SURGERY     x 2     OB History   None      Home Medications    Prior to Admission medications   Medication Sig Start Date End Date Taking? Authorizing Provider  cetirizine (ZYRTEC) 10 MG tablet Take 10 mg by mouth daily.   Yes [provider]  fluticasone (FLONASE) 50 MCG/ACT nasal spray Place 1 spray into both nostrils daily.   Yes [provider]  IRON PO  Take 2 tablets by mouth daily.   Yes [provider]  diazepam (VALIUM) 5 MG tablet Take 1 tablet (5 mg total) by mouth every 8 (eight) hours as needed for muscle spasms. Patient not taking: Reported on 10/13/2018 04/09/15   Earleen Newport, MD  oxyCODONE-acetaminophen (PERCOCET) 10-325 MG per tablet Take 1 tablet by mouth every 4 (four) hours as needed for pain. Patient not taking: Reported on 10/13/2018 06/15/15   Lorette Ang, MD  Prenatal Vit-DSS-Fe Cbn-FA (PRENATAL AD) tablet Take 1 tablet by mouth daily.    [provider]    Family History No family history on file.  Social History Social History   Tobacco Use  . Smoking status: Never Smoker  Substance Use Topics  . Alcohol use: Yes    Comment: occassional  . Drug use: Not on file     Allergies   Shrimp [shellfish allergy] and Penicillins   Review of Systems Review of Systems  Neurological: Positive for headaches.  All other systems reviewed and are negative.    Physical Exam Updated Vital Signs There were no vitals taken for this visit.  Physical Exam  Constitutional: She is oriented to person, place, and time.  Slightly uncomfortable   HENT:  Head: Normocephalic.  Mouth/Throat: Oropharynx is clear and moist.  Eyes: Pupils are equal, round, and reactive to light. EOM are normal.  Neck: Normal range of motion. Neck supple.  No meningeal  signs   Cardiovascular: Normal rate.  Pulmonary/Chest: Effort normal and breath sounds normal.  Abdominal: Soft. Bowel sounds are normal.  Musculoskeletal: Normal range of motion.  Neurological: She is alert and oriented to person, place, and time.  CN 2- 12 intact. No obvious facial droop. Slightly dec sensation around R thumb but nl hand grasp and nl elbow flexion and extension. Nl finger to nose bilaterally   Skin: Skin is warm. Capillary refill takes less than 2 seconds.  Psychiatric: She has a normal mood and affect. Her behavior is normal.    Nursing note and vitals reviewed.    ED Treatments / Results  Labs (all labs ordered are listed, but only abnormal results are displayed) Labs Reviewed  APTT - Abnormal; Notable for the following components:      Result Value   aPTT 39 (*)    All other components within normal limits  COMPREHENSIVE METABOLIC PANEL - Abnormal; Notable for the following components:   Glucose, Bld 114 (*)    All other components within normal limits  GLUCOSE, CAPILLARY - Abnormal; Notable for the following components:   Glucose-Capillary 108 (*)    All other components within normal limits  PROTIME-INR  CBC  DIFFERENTIAL  TROPONIN I  CBG MONITORING, ED  POC URINE PREG, ED    EKG None  Radiology Ct Head Code Stroke Wo Contrast  Result Date: 10/13/2018 CLINICAL DATA:  Code stroke.  Right hand numbness and tingling EXAM: CT HEAD WITHOUT CONTRAST TECHNIQUE: Contiguous axial images were obtained from the base of the skull through the vertex without intravenous contrast. COMPARISON:  None. FINDINGS: Brain: No evidence of acute infarction, hemorrhage, hydrocephalus, extra-axial collection or mass lesion/mass effect. Vascular: Negative for hyperdense vessel Skull: Negative Sinuses/Orbits: Negative Other: None ASPECTS ([place] Stroke Program Early CT Score) - Ganglionic level infarction (caudate, lentiform nuclei, internal capsule, insula, M1-M3 cortex): 7 - Supraganglionic infarction (M4-M6 cortex): 3 Total score (0-10 with 10 being normal): 10 IMPRESSION: 1. Negative CT head 2. ASPECTS is 10 3. These results were called by telephone at the time of interpretation on 10/13/2018 at 8:57 am to Dr. Lenise Arena , who verbally acknowledged these results. Electronically Signed   By: Franchot Gallo M.D.   On: 10/13/2018 08:57    Procedures Procedures (including critical care time)  Medications Ordered in ED Medications  sodium chloride 0.9 % bolus 1,000 mL (1,000 mLs Intravenous New Bag/Given 10/13/18  0912)  metoCLOPramide (REGLAN) injection 10 mg (10 mg Intravenous Given 10/13/18 0914)  diphenhydrAMINE (BENADRYL) injection 25 mg (25 mg Intravenous Given 10/13/18 0913)  dexamethasone (DECADRON) injection 4 mg (4 mg Intravenous Given 10/13/18 0913)     Initial Impression / Assessment and Plan / ED Course  I have reviewed the triage vital signs and the nursing notes.  Pertinent labs & imaging results that were available during my care of the patient were reviewed by me and considered in my medical decision making (see chart for details).    Jenna Cameron is a 42 y.o. female here with headaches. Likely complex migraines. Code stroke activated by triage. Dr. Doy Mince at bedside and will evaluate patient. Will get labs, CT head.   9 am Dr. Doy Mince recommend migraine cocktail, MRI brain. Doesn't need TPA currently as numbness has resolved now.   2:27 PM MRI brain unremarkable. Headache improved with migraine cocktail. I talked to Dr. Doy Mince. She thinks likely complex migraines. Will dc home with imitrex prn, outpatient neuro follow up    Final Clinical  Impressions(s) / ED Diagnoses   Final diagnoses:  None    ED Discharge Orders    None       Drenda Freeze, MD 10/13/18 1428

## 2018-10-13 NOTE — ED Notes (Signed)
Pt talked to MRI on the phone.

## 2018-10-13 NOTE — ED Notes (Signed)
Patient transported to MRI 

## 2018-10-13 NOTE — Consult Note (Addendum)
Referring Physician: Drenda Freeze    Chief Complaint: Headache and right hand numbness and tingling  HPI: Jenna Cameron is an 42 y.o. female with past medical history of abnormal uterine bleeding, diet controlled DM, fibroids, allergic rhinitis, anemia and chronic catamenial headaches presenting to the ED with chief complaints of right sided headache associated with nausea, numbness and tingling in right hand. She reports onset of symptoms since 3 weeks ago and has progressively increased in frequency, intensity and severity. Patient state her headache worsened this morning and had to go to urgent care for further evaluation. The headache is intermittent, moderate to severe in intensity, located predominantly in her right side and bilateral supraorbital. She describes pain as throbbing, pressure and dull aching. Headaches partially responds to over the counter analgesics. Denies light and noise sensitivity, irritability and difficulty with concentration. Symptoms seem to worsen with movement and physical activity. Nothing seem to make it better. Denies associated altered sensorium, speech abnormality, cranial nerve deficit, seizures, focal motor deficits, dizziness, diplopia, or vomiting, syncope or LOC. Patient state that she developed paresthesia (numbness, tingling, pins-and-needles sensation) in her right hand while at urgent care. She was referred to the ED for further evaluation of possible stroke.On arrival to the ED code stroke was initiated. Initial CT head did not show acute intracranial abnormality, NIH stroke scale was 0. Patient report that symptoms in her right hand had resolved but was still having headache rating at 7/10. Patient was not deemed a candidate for IV tPA due to NIH stroke scale of 0 and resolving symptoms.  Date last known well: Date: 10/13/2018 Time last known well: Time: 08:00 tPA Given: No: NIH stroke scale 0, resolving symptoms  Past Medical History:  Diagnosis  Date  . Asthma    in highschool  . Iron (Fe) deficiency anemia   . Irregular menses     Past Surgical History:  Procedure Laterality Date  . CHROMOPERTUBATION N/A 06/15/2015   Procedure: CHROMOPERTUBATION;  Surgeon: Lorette Ang, MD;  Location: [facility] ORS;  Service: Gynecology;  Laterality: N/A;  . DILATATION & CURETTAGE/HYSTEROSCOPY WITH MYOSURE N/A 06/15/2015   Procedure: DILATATION & CURETTAGE/HYSTEROSCOPY WITH MYOSURE;  Surgeon: Lorette Ang, MD;  Location: [facility] ORS;  Service: Gynecology;  Laterality: N/A;  . LAPAROSCOPY N/A 06/15/2015   Procedure: LAPAROSCOPY DIAGNOSTIC;  Surgeon: Lorette Ang, MD;  Location: [facility] ORS;  Service: Gynecology;  Laterality: N/A;  . UTERINE FIBROID SURGERY     x 2    Family History  Problem Relation Age of Onset  . Stroke Mother     Social History:  reports that she has never smoked. She does not have any smokeless tobacco history on file. She reports that she drinks alcohol. Her drug history is not on file.  Allergies:  Allergies  Allergen Reactions  . Shrimp [Shellfish Allergy] Shortness Of Breath  . Penicillins Rash    Medications: I have reviewed the patient's current medications. Prior to Admission medications   Medication Sig Start Date End Date Taking? Authorizing Provider  cetirizine (ZYRTEC) 10 MG tablet Take 10 mg by mouth daily.   Yes [provider]  fluticasone (FLONASE) 50 MCG/ACT nasal spray Place 1 spray into both nostrils daily.   Yes [provider]  IRON PO Take 2 tablets by mouth daily.   Yes [provider]  diazepam (VALIUM) 5 MG tablet Take 1 tablet (5 mg total) by mouth every 8 (eight) hours as needed for muscle spasms. Patient not taking: Reported  on 10/13/2018 04/09/15   Earleen Newport, MD  oxyCODONE-acetaminophen (PERCOCET) 10-325 MG per tablet Take 1 tablet by mouth every 4 (four) hours as needed for pain. Patient not taking: Reported on 10/13/2018 06/15/15   Lorette Ang,  MD  Prenatal Vit-DSS-Fe Cbn-FA (PRENATAL AD) tablet Take 1 tablet by mouth daily.    [provider]    Prior to Admission:  (Not in a hospital admission) Scheduled:  ROS: History obtained from the patient   General ROS: negative for - chills, fatigue, fever, night sweats, weight gain or weight loss Psychological ROS: negative for - behavioral disorder, hallucinations, memory difficulties, mood swings or suicidal ideation Ophthalmic ROS: negative for - blurry vision, double vision, eye pain or loss of vision ENT ROS: negative for - epistaxis, nasal discharge, oral lesions, sore throat, tinnitus or vertigo Allergy and Immunology ROS: negative for - hives or itchy/watery eyes Hematological and Lymphatic ROS: negative for - bleeding problems, bruising or swollen lymph nodes Endocrine ROS: negative for - galactorrhea, hair pattern changes, polydipsia/polyuria or temperature intolerance Respiratory ROS: negative for - cough, hemoptysis, shortness of breath or wheezing Cardiovascular ROS: negative for - chest pain, dyspnea on exertion, edema or irregular heartbeat Gastrointestinal ROS: negative for - abdominal pain, diarrhea, hematemesis, vomiting or stool incontinence. Positive for nausea Genito-Urinary ROS: negative for - dysuria, hematuria, incontinence or urinary frequency/urgency Musculoskeletal ROS: negative for - joint swelling or muscular weakness Neurological ROS: as noted in HPI Dermatological ROS: negative for rash and skin lesion changes  Physical Examination: There were no vitals taken for this visit.   HEENT-  Normocephalic, no lesions, without obvious abnormality.  Normal external eye and conjunctiva.  Normal TM's bilaterally.  Normal auditory canals and external ears. Normal external nose, mucus membranes and septum.  Normal pharynx. Cardiovascular- S1, S2 normal, pulses palpable throughout   Lungs- chest clear, no wheezing, rales, normal symmetric air  entry Abdomen- soft, non-tender; bowel sounds normal; no masses,  no organomegaly Extremities- no edema Lymph-no adenopathy palpable Musculoskeletal-no joint tenderness, deformity or swelling Skin-warm and dry, no hyperpigmentation, vitiligo, or suspicious lesions  Neurological Exam   Mental Status: Alert, oriented, thought content appropriate.  Speech fluent without evidence of aphasia.  Able to follow 3 step commands without difficulty. Attention span and concentration seemed appropriate  Cranial Nerves: II: Discs flat bilaterally; Visual fields grossly normal, pupils equal, round, reactive to light and accommodation III,IV, VI: ptosis not present, extra-ocular motions intact bilaterally V,VII: smile symmetric, facial light touch sensation intact VIII: hearing normal bilaterally IX,X: gag reflex present XI: bilateral shoulder shrug XII: midline tongue extension Motor: Right :  Upper extremity   5/5 Without pronator drift      Left: Upper extremity   5/5 without pronator drift Right:   Lower extremity   5/5                                          Left: Lower extremity   5/5 Tone and bulk:normal tone throughout; no atrophy noted Sensory: Pinprick and light touch intact bilaterally Deep Tendon Reflexes: 2+ and symmetric throughout Plantars: Right: downgoing                             Left: downgoing Cerebellar: Finger-to-nose testing intact bilaterally. Heel to shin testing normal bilaterally Gait: not tested due to safety concerns  Data Reviewed  Laboratory Studies:  Basic Metabolic Panel: Recent Labs  Lab 10/13/18 0903  NA 139  K 4.4  CL 106  CO2 25  GLUCOSE 114*  BUN 11  CREATININE 0.74  CALCIUM 9.3    Liver Function Tests: Recent Labs  Lab 10/13/18 0903  AST 34  ALT 22  ALKPHOS 56  BILITOT 0.9  PROT 7.7  ALBUMIN 4.4   No results for input(s): LIPASE, AMYLASE in the last 168 hours. No results for input(s): AMMONIA in the last 168 hours.  CBC: Recent  Labs  Lab 10/13/18 0903  WBC 4.9  NEUTROABS 3.2  HGB 12.9  HCT 40.5  MCV 82.7  PLT 254    Cardiac Enzymes: Recent Labs  Lab 10/13/18 0903  TROPONINI <0.03    BNP: Invalid input(s): POCBNP  CBG: Recent Labs  Lab 10/13/18 0840  GLUCAP 108*    Microbiology: No results found for this or any previous visit.  Coagulation Studies: Recent Labs    10/13/18 0903  LABPROT 12.7  INR 0.96    Urinalysis: No results for input(s): COLORURINE, LABSPEC, PHURINE, GLUCOSEU, HGBUR, BILIRUBINUR, KETONESUR, PROTEINUR, UROBILINOGEN, NITRITE, LEUKOCYTESUR in the last 168 hours.  Invalid input(s): APPERANCEUR  Lipid Panel: No results found for: CHOL, TRIG, HDL, CHOLHDL, VLDL, LDLCALC  HgbA1C: No results found for: HGBA1C  Urine Drug Screen:  No results found for: LABOPIA, COCAINSCRNUR, LABBENZ, AMPHETMU, THCU, LABBARB  Alcohol Level: No results for input(s): ETH in the last 168 hours.  Other results: EKG: normal EKG, normal sinus rhythm, unchanged from previous tracings. Vent. rate 69 BPM PR interval * ms QRS duration 89 ms QT/QTc 410/440 ms P-R-T axes 153 112 125  Imaging: Ct Head Code Stroke Wo Contrast  Result Date: 10/13/2018 CLINICAL DATA:  Code stroke.  Right hand numbness and tingling EXAM: CT HEAD WITHOUT CONTRAST TECHNIQUE: Contiguous axial images were obtained from the base of the skull through the vertex without intravenous contrast. COMPARISON:  None. FINDINGS: Brain: No evidence of acute infarction, hemorrhage, hydrocephalus, extra-axial collection or mass lesion/mass effect. Vascular: Negative for hyperdense vessel Skull: Negative Sinuses/Orbits: Negative Other: None ASPECTS ([place] Stroke Program Early CT Score) - Ganglionic level infarction (caudate, lentiform nuclei, internal capsule, insula, M1-M3 cortex): 7 - Supraganglionic infarction (M4-M6 cortex): 3 Total score (0-10 with 10 being normal): 10 IMPRESSION: 1. Negative CT head 2. ASPECTS is 10 3. These  results were called by telephone at the time of interpretation on 10/13/2018 at 8:57 am to Dr. Lenise Arena , who verbally acknowledged these results. Electronically Signed   By: Franchot Gallo M.D.   On: 10/13/2018 08:57   Patient seen and examined.  Clinical course and management discussed.  Necessary edits performed.  I agree with the above.  Assessment and plan of care developed and discussed below.   Assessment: 42 y.o. female with past medical history of abnormal uterine bleeding, diet controlled DM, fibroids, allergic rhinitis, anemia and chronic headaches presenting to the ED with chief complaints of right sided headache associated with nausea, numbness and tingling in right hand. Initial CT head reviewed and show no acute intracranial abnormality. TIA and complex migraine in the differentials. Pending follow up MRI brain. Patient state she was not taking antiplatelets or anticoagulation prior to this event. 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 She is not on any preventive medication for her headache.  Stroke Risk Factors - diabetes mellitus and family history  Plan: 1. MRI  of the brain without contrast 2. Prophylactic therapy-Antiplatelet med: Aspirin - dose 81 mg/day 3. Will treat headache with cocktail and if MRI brain unremarkable no further work up indicated. 4. Patient to follow up with neurology on an outpatient basis for management of her migraine headaches.    This patient was staffed with Dr. Magda Paganini, Doy Mince who personally evaluated patient, reviewed documentation and agreed with assessment and plan of care as above.  Rufina Falco, DNP, FNP-BC Board certified Nurse Practitioner Neurology Department  10/13/2018, 10:16 AM  Alexis Goodell, MD Neurology [phone]  10/13/2018  12:09 PM

## 2018-10-13 NOTE — ED Notes (Signed)
Pt cleared by Md to eat and drink, pt given sandwich tray and water to drink. Pt able to tolerate po intake without difficulties. Pt also states she no longer is experiencing numbness or tingling.

## 2018-10-13 NOTE — Discharge Instructions (Signed)
Take tylenol, motrin for headaches.   Take imitrex for severe headaches.   See your doctor. Follow up with neurologist   Return to ER if you have worse headaches, numbness, vomiting.

## 2018-10-13 NOTE — Progress Notes (Signed)
CODE STROKE- PHARMACY COMMUNICATION   Time CODE STROKE called/page received:@ 0840  Time response to CODE STROKE was made (in person or via phone):  @ [phone]  Time Stroke Kit retrieved from [place] (only if needed):N/A  Name of Provider/Nurse contacted:Kate,RN  Past Medical History:  Diagnosis Date  . Asthma    in highschool  . Iron (Fe) deficiency anemia   . Irregular menses    Prior to Admission medications   Medication Sig Start Date End Date Taking? Authorizing Provider  cetirizine (ZYRTEC) 10 MG tablet Take 10 mg by mouth daily.   Yes [provider]  fluticasone (FLONASE) 50 MCG/ACT nasal spray Place 1 spray into both nostrils daily.   Yes [provider]  IRON PO Take 2 tablets by mouth daily.   Yes [provider]  diazepam (VALIUM) 5 MG tablet Take 1 tablet (5 mg total) by mouth every 8 (eight) hours as needed for muscle spasms. Patient not taking: Reported on 10/13/2018 04/09/15   Earleen Newport, MD  oxyCODONE-acetaminophen (PERCOCET) 10-325 MG per tablet Take 1 tablet by mouth every 4 (four) hours as needed for pain. Patient not taking: Reported on 10/13/2018 06/15/15   Lorette Ang, MD  Prenatal Vit-DSS-Fe Cbn-FA (PRENATAL AD) tablet Take 1 tablet by mouth daily.    [provider]    Pernell Dupre, PharmD, BCPS Clinical Pharmacist 10/13/2018 9:42 AM

## 2018-10-13 NOTE — ED Triage Notes (Addendum)
Arrives from [facility] for ED evaluation of headache, right hand numbness and tingling to right hand and forearm.  States last known well was at 0800   AAOx3.  Skin warm and dry.  Speech clear.  MAE equally and strong.

## 2018-10-13 NOTE — ED Notes (Signed)
Pt ambulatory to toilet by self.  

## 2018-10-13 NOTE — ED Notes (Signed)
Pt states HA intermittently x 3 weeks. States she doesn't take any meds for HA and does not see neurologist. States HA began again at [place]. Pt has family member at bedside. No distress noted at this time.

## 2018-10-13 NOTE — ED Triage Notes (Signed)
Pt arrives to room 11 accompanied by [facility] ED medic and [facility] stroke RN and Dr. Doy Mince. Pt had airway clearance performed by Dr. Darl Householder and just arrived to room from CT.   Speech clear, moving all extremities, no drift noted.

## 2018-11-11 ENCOUNTER — Other Ambulatory Visit: Payer: Self-pay | Admitting: Family Medicine

## 2018-11-11 DIAGNOSIS — Z1231 Encounter for screening mammogram for malignant neoplasm of breast: Secondary | ICD-10-CM

## 2019-01-13 ENCOUNTER — Other Ambulatory Visit: Payer: Self-pay

## 2019-01-13 ENCOUNTER — Encounter (INDEPENDENT_AMBULATORY_CARE_PROVIDER_SITE_OTHER): Payer: Self-pay

## 2019-01-13 ENCOUNTER — Ambulatory Visit
Admission: RE | Admit: 2019-01-13 | Discharge: 2019-01-13 | Disposition: A | Discharge status: Home / Self Care | Payer: BC Managed Care – PPO | Source: Ambulatory Visit | Attending: Family Medicine | Admitting: Family Medicine

## 2019-01-13 DIAGNOSIS — Z1231 Encounter for screening mammogram for malignant neoplasm of breast: Secondary | ICD-10-CM | POA: Insufficient documentation

## 2019-05-10 DIAGNOSIS — I10 Essential (primary) hypertension: Secondary | ICD-10-CM | POA: Diagnosis present

## 2019-11-17 ENCOUNTER — Other Ambulatory Visit: Payer: Self-pay | Admitting: Family Medicine

## 2019-11-17 DIAGNOSIS — Z1231 Encounter for screening mammogram for malignant neoplasm of breast: Secondary | ICD-10-CM

## 2020-01-01 ENCOUNTER — Ambulatory Visit: Payer: BC Managed Care – PPO | Attending: Internal Medicine

## 2020-01-01 DIAGNOSIS — Z23 Encounter for immunization: Secondary | ICD-10-CM

## 2020-01-01 NOTE — Progress Notes (Signed)
   Covid-19 Vaccination Clinic  Name:  Jenna Cameron    MRN: [id number] DOB: 1976/08/06  01/01/2020  [name] was observed post Covid-19 immunization for 15 minutes without incidence. She was provided with Vaccine Information Sheet and instruction to access the V-Safe system.   [name] was instructed to call 911 with any severe reactions post vaccine: Marland Kitchen Difficulty breathing  . Swelling of your face and throat  . A fast heartbeat  . A bad rash all over your body  . Dizziness and weakness    Immunizations Administered    Name Date Dose VIS Date Route   Pfizer COVID-19 Vaccine 01/01/2020 11:15 AM 0.3 mL 10/14/2019 Intramuscular   Manufacturer: [place]   Lot: [id number]   [place]: [id number]

## 2020-01-23 ENCOUNTER — Ambulatory Visit: Payer: BC Managed Care – PPO | Attending: Internal Medicine

## 2020-01-23 DIAGNOSIS — Z23 Encounter for immunization: Secondary | ICD-10-CM

## 2020-01-23 NOTE — Progress Notes (Signed)
   Covid-19 Vaccination Clinic  Name:  Jenna Cameron    MRN: [id number] DOB: May 04, 1976  01/23/2020  [name] was observed post Covid-19 immunization for 15 minutes without incident. She was provided with Vaccine Information Sheet and instruction to access the V-Safe system.   [name] was instructed to call 911 with any severe reactions post vaccine: Marland Kitchen Difficulty breathing  . Swelling of face and throat  . A fast heartbeat  . A bad rash all over body  . Dizziness and weakness   Immunizations Administered    Name Date Dose VIS Date Route   Pfizer COVID-19 Vaccine 01/23/2020 10:16 AM 0.3 mL 10/14/2019 Intramuscular   Manufacturer: [place]   Lot: [id number]   [place]: [id number]

## 2020-05-15 ENCOUNTER — Ambulatory Visit: Payer: BC Managed Care – PPO

## 2020-05-18 ENCOUNTER — Ambulatory Visit
Admission: RE | Admit: 2020-05-18 | Discharge: 2020-05-18 | Disposition: A | Discharge status: Home / Self Care | Payer: BC Managed Care – PPO | Source: Ambulatory Visit | Attending: Family Medicine | Admitting: Family Medicine

## 2020-05-18 DIAGNOSIS — Z1231 Encounter for screening mammogram for malignant neoplasm of breast: Secondary | ICD-10-CM | POA: Diagnosis not present

## 2021-02-26 ENCOUNTER — Other Ambulatory Visit: Payer: Self-pay | Admitting: Family Medicine

## 2021-02-26 DIAGNOSIS — Z1231 Encounter for screening mammogram for malignant neoplasm of breast: Secondary | ICD-10-CM

## 2021-05-20 ENCOUNTER — Other Ambulatory Visit: Payer: Self-pay

## 2021-05-20 ENCOUNTER — Ambulatory Visit
Admission: RE | Admit: 2021-05-20 | Discharge: 2021-05-20 | Disposition: A | Discharge status: Home / Self Care | Payer: BC Managed Care – PPO | Source: Ambulatory Visit | Attending: Family Medicine | Admitting: Family Medicine

## 2021-05-20 DIAGNOSIS — Z1231 Encounter for screening mammogram for malignant neoplasm of breast: Secondary | ICD-10-CM

## 2021-12-09 ENCOUNTER — Observation Stay
Admission: EM | Admit: 2021-12-09 | Discharge: 2021-12-10 | Disposition: A | Discharge status: Home / Self Care | Payer: BC Managed Care – PPO | Attending: Internal Medicine | Admitting: Internal Medicine

## 2021-12-09 ENCOUNTER — Encounter: Payer: Self-pay | Admitting: *Deleted

## 2021-12-09 ENCOUNTER — Emergency Department: Payer: BC Managed Care – PPO

## 2021-12-09 ENCOUNTER — Other Ambulatory Visit: Payer: Self-pay

## 2021-12-09 DIAGNOSIS — J45909 Unspecified asthma, uncomplicated: Secondary | ICD-10-CM | POA: Insufficient documentation

## 2021-12-09 DIAGNOSIS — R202 Paresthesia of skin: Secondary | ICD-10-CM | POA: Diagnosis present

## 2021-12-09 DIAGNOSIS — R200 Anesthesia of skin: Secondary | ICD-10-CM

## 2021-12-09 DIAGNOSIS — R29818 Other symptoms and signs involving the nervous system: Secondary | ICD-10-CM

## 2021-12-09 DIAGNOSIS — I10 Essential (primary) hypertension: Secondary | ICD-10-CM | POA: Diagnosis not present

## 2021-12-09 DIAGNOSIS — G459 Transient cerebral ischemic attack, unspecified: Principal | ICD-10-CM | POA: Insufficient documentation

## 2021-12-09 DIAGNOSIS — G43909 Migraine, unspecified, not intractable, without status migrainosus: Secondary | ICD-10-CM

## 2021-12-09 DIAGNOSIS — Z20822 Contact with and (suspected) exposure to covid-19: Secondary | ICD-10-CM | POA: Diagnosis not present

## 2021-12-09 DIAGNOSIS — Z79899 Other long term (current) drug therapy: Secondary | ICD-10-CM | POA: Diagnosis not present

## 2021-12-09 LAB — COMPREHENSIVE METABOLIC PANEL
ALT: 17 U/L (ref 0–44)
AST: 22 U/L (ref 15–41)
Albumin: 4.3 g/dL (ref 3.5–5.0)
Alkaline Phosphatase: 47 U/L (ref 38–126)
Anion gap: 8 (ref 5–15)
BUN: 13 mg/dL (ref 6–20)
CO2: 29 mmol/L (ref 22–32)
Calcium: 9.5 mg/dL (ref 8.9–10.3)
Chloride: 101 mmol/L (ref 98–111)
Creatinine, Ser: 0.8 mg/dL (ref 0.44–1.00)
GFR, Estimated: >60 mL/min (ref >60)
Glucose, Bld: 118 mg/dL — ABNORMAL HIGH (ref 70–99)
Potassium: 3.7 mmol/L (ref 3.5–5.1)
Sodium: 138 mmol/L (ref 135–145)
Total Bilirubin: 0.6 mg/dL (ref 0.3–1.2)
Total Protein: 7.7 g/dL (ref 6.5–8.1)

## 2021-12-09 LAB — URINE DRUG SCREEN, QUALITATIVE (ARMC ONLY)
Amphetamines, Ur Screen: NOT DETECTED
Barbiturates, Ur Screen: NOT DETECTED
Benzodiazepine, Ur Scrn: NOT DETECTED
Cannabinoid 50 Ng, Ur ~~LOC~~: NOT DETECTED
Cocaine Metabolite,Ur ~~LOC~~: NOT DETECTED
MDMA (Ecstasy)Ur Screen: NOT DETECTED
Methadone Scn, Ur: NOT DETECTED
Opiate, Ur Screen: NOT DETECTED
Phencyclidine (PCP) Ur S: NOT DETECTED
Tricyclic, Ur Screen: NOT DETECTED

## 2021-12-09 LAB — ETHANOL: Alcohol, Ethyl (B): <10 mg/dL (ref <10)

## 2021-12-09 LAB — DIFFERENTIAL
Abs Immature Granulocytes: 0.02 10*3/uL (ref 0.00–0.07)
Basophils Absolute: 0 10*3/uL (ref 0.0–0.1)
Basophils Relative: 1 %
Eosinophils Absolute: 0.2 10*3/uL (ref 0.0–0.5)
Eosinophils Relative: 3 %
Immature Granulocytes: 0 %
Lymphocytes Relative: 38 %
Lymphs Abs: 1.9 10*3/uL (ref 0.7–4.0)
Monocytes Absolute: 0.2 10*3/uL (ref 0.1–1.0)
Monocytes Relative: 4 %
Neutro Abs: 2.7 10*3/uL (ref 1.7–7.7)
Neutrophils Relative %: 54 %

## 2021-12-09 LAB — URINALYSIS, ROUTINE W REFLEX MICROSCOPIC
Bilirubin Urine: NEGATIVE
Glucose, UA: NEGATIVE mg/dL
Ketones, ur: NEGATIVE mg/dL
Nitrite: NEGATIVE
Protein, ur: NEGATIVE mg/dL
Specific Gravity, Urine: 1.01 (ref 1.005–1.030)
Squamous Epithelial / HPF: NONE SEEN (ref 0–5)
pH: 7 (ref 5.0–8.0)

## 2021-12-09 LAB — CBC
HCT: 38.3 % (ref 36.0–46.0)
Hemoglobin: 12.5 g/dL (ref 12.0–15.0)
MCH: 26.1 pg (ref 26.0–34.0)
MCHC: 32.6 g/dL (ref 30.0–36.0)
MCV: 80 fL (ref 80.0–100.0)
Platelets: 229 10*3/uL (ref 150–400)
RBC: 4.79 MIL/uL (ref 3.87–5.11)
RDW: 13.7 % (ref 11.5–15.5)
WBC: 5 10*3/uL (ref 4.0–10.5)
nRBC: 0 % (ref 0.0–0.2)

## 2021-12-09 LAB — RESP PANEL BY RT-PCR (FLU A&B, COVID) ARPGX2
Influenza A by PCR: NEGATIVE
Influenza B by PCR: NEGATIVE
SARS Coronavirus 2 by RT PCR: NEGATIVE

## 2021-12-09 LAB — APTT: aPTT: 35 seconds (ref 24–36)

## 2021-12-09 LAB — PROTIME-INR
INR: 0.9 (ref 0.8–1.2)
Prothrombin Time: 12 seconds (ref 11.4–15.2)

## 2021-12-09 LAB — POC URINE PREG, ED: Preg Test, Ur: NEGATIVE

## 2021-12-09 MED ORDER — DIPHENHYDRAMINE HCL 50 MG/ML IJ SOLN
12.5000 mg | Freq: Once | INTRAMUSCULAR | Status: AC
Start: 2021-12-09 — End: 2021-12-09
  Administered 2021-12-09: 12.5 mg via INTRAVENOUS
  Filled 2021-12-09: qty 1

## 2021-12-09 MED ORDER — ACETAMINOPHEN 325 MG RE SUPP
650.0000 mg | RECTAL | Status: DC | PRN
  Filled 2021-12-09: qty 2

## 2021-12-09 MED ORDER — PROCHLORPERAZINE EDISYLATE 10 MG/2ML IJ SOLN
10.0000 mg | Freq: Once | INTRAMUSCULAR | Status: AC
  Administered 2021-12-09: 10 mg via INTRAVENOUS
  Filled 2021-12-09: qty 2

## 2021-12-09 MED ORDER — ACETAMINOPHEN 325 MG PO TABS: 650.0000 mg | ORAL_TABLET | ORAL | Status: DC | PRN

## 2021-12-09 MED ORDER — ENOXAPARIN SODIUM 60 MG/0.6ML IJ SOSY: 0.5000 mg/kg | PREFILLED_SYRINGE | INTRAMUSCULAR | Status: DC

## 2021-12-09 MED ORDER — ACETAMINOPHEN 160 MG/5ML PO SOLN
650.0000 mg | ORAL | Status: DC | PRN
  Filled 2021-12-09: qty 20.3

## 2021-12-09 MED ORDER — STROKE: EARLY STAGES OF RECOVERY BOOK: Freq: Once | Status: AC

## 2021-12-09 NOTE — ED Notes (Signed)
Chaplin @ the bedside.

## 2021-12-09 NOTE — ED Notes (Signed)
Fsbs 106 in triage.

## 2021-12-09 NOTE — Assessment & Plan Note (Addendum)
Hold home Diovan/HCTZ for permissive hypertension

## 2021-12-09 NOTE — ED Provider Notes (Signed)
[facility] Provider Note    Event Date/Time   First MD Initiated Contact with Patient 12/09/21 2111     (approximate)   History   Numbness   HPI  Jenna Cameron is a 46 y.o. female  with history of asthma, anemia who comes in with concerns for numbness.  Patient reports that first around 8 PM she developed some numbness on the left side of her arm and left side of her face.  She did start developing a headache that was mild in nature.  Reports a history of migraines but never having numbness associated with them.  She reports that the symptoms have been gradually getting better but she was worried about stroke so wanted to come in to be evaluated.  She denies any chest pain, shortness of breath or any other symptoms     Physical Exam   Triage Vital Signs: ED Triage Vitals  Enc Vitals Group     BP 12/09/21 2100 (!) 135/99     Pulse Rate 12/09/21 2100 67     Resp 12/09/21 2100 18     Temp 12/09/21 2100 98.5 F (36.9 C)     Temp Source 12/09/21 2100 Oral     SpO2 12/09/21 2100 99 %     Weight 12/09/21 2059 199 lb (90.3 kg)     Height 12/09/21 2059 5\' 1"  (1.549 m)     Head Circumference --      Peak Flow --      Pain Score 12/09/21 2058 2     Pain Loc --      Pain Edu? --      Excl. in [place]? --     Most recent vital signs: Vitals:   12/09/21 2206 12/09/21 2257  BP: (!) 156/93 (!) 136/97  Pulse: 65 (!) 58  Resp: 19 16  Temp:  97.8 F (36.6 C)  SpO2: 100% 98%     General: Awake, no distress.  CV:  Good peripheral perfusion.  Resp:  Normal effort.  Abd:  No distention.  Other:  NIHSS of 1 for some minimal left-sided   ED Results / Procedures / Treatments   Labs (all labs ordered are listed, but only abnormal results are displayed) Labs Reviewed  COMPREHENSIVE METABOLIC PANEL - Abnormal; Notable for the following components:      Result Value   Glucose, Bld 118 (*)    All other components within normal limits  URINALYSIS, ROUTINE W  REFLEX MICROSCOPIC - Abnormal; Notable for the following components:   APPearance CLEAR (*)    Hgb urine dipstick SMALL (*)    Leukocytes,Ua TRACE (*)    Bacteria, UA RARE (*)    All other components within normal limits  RESP PANEL BY RT-PCR (FLU A&B, COVID) ARPGX2  ETHANOL  PROTIME-INR  APTT  CBC  DIFFERENTIAL  URINE DRUG SCREEN, QUALITATIVE ([facility] ONLY)  HEMOGLOBIN A1C  LIPID PANEL  HIV ANTIBODY (ROUTINE TESTING W REFLEX)  POC URINE PREG, ED     EKG  My interpretation of EKG:  Normal sinus rate 64, no ST elevation, no T wave inversions, normal intervals  RADIOLOGY CT  head no ICH I did review this personally.  PROCEDURES:  Critical Care performed: No  .1-3 Lead EKG Interpretation Performed by: Vanessa [place], MD Authorized by: Vanessa [place], MD     Interpretation: normal     ECG rate:  60   ECG rate assessment: normal     Rhythm: sinus  rhythm     Ectopy: none     Conduction: normal     MEDICATIONS ORDERED IN ED: Medications  acetaminophen (TYLENOL) tablet 650 mg (has no administration in time range)    Or  acetaminophen (TYLENOL) 160 MG/5ML solution 650 mg (has no administration in time range)    Or  acetaminophen (TYLENOL) suppository 650 mg (has no administration in time range)  enoxaparin (LOVENOX) injection 45 mg (has no administration in time range)  prochlorperazine (COMPAZINE) injection 10 mg (10 mg Intravenous Given 12/09/21 2218)  diphenhydrAMINE (BENADRYL) injection 12.5 mg (12.5 mg Intravenous Given 12/09/21 2218)   stroke: mapping our early stages of recovery book ( Does not apply Given 12/09/21 2313)     IMPRESSION / MDM / ASSESSMENT AND PLAN / ED COURSE  I reviewed the triage vital signs and the nursing notes.                              Differential diagnosis includes, but is not limited to TIA, stroke, complex migraine.  No chest pain to suggest dissection.  Stroke code called from triage  Pregnancy test negative UA no UTI CMP  normal CBC: normal INR normal  CT head negative  Symptoms are resolving.  Discussed with the neurologist who recommends admission for stroke work-up  The patient is on the cardiac monitor to evaluate for evidence of arrhythmia and/or significant heart rate changes.   FINAL CLINICAL IMPRESSION(S) / ED DIAGNOSES   Final diagnoses:  Numbness     Rx / DC Orders   ED Discharge Orders     None        Note:  This document was prepared using Dragon voice recognition software and may include unintentional dictation errors.   Vanessa [place], MD 12/09/21 [phone]

## 2021-12-09 NOTE — Assessment & Plan Note (Signed)
Etiology related either to stroke or complex migraine per teleneurology Stroke work-up as recommended by teleneurology to include:       Stroke/Telemetry Floor       Neuro Checks       Bedside Swallow Eval       DVT Prophylaxis       IV Fluids, Normal Saline       Head of Bed 30 Degrees       Euglycemia and Avoid Hyperthermia (PRN Acetaminophen)       Initiate or continue Aspirin 325 MG daily       Antihypertensives PRN if Blood pressure is greater than 220/120 or there is a concern for End organ damage/contraindications for permissive HTN. If blood pressure is greater than 220/120 give labetalol PO or IV or Vasotec IV with a goal of 15% reduction in BP during the first 24 hours.  Routine Consultation with [facility] for Follow up Care

## 2021-12-09 NOTE — ED Triage Notes (Signed)
Pt report numbness in left side of face and left hand.  Pt reports a headache.  Sx for 1 hour.  Pt alert speech clear.

## 2021-12-09 NOTE — Assessment & Plan Note (Signed)
Treated with IV Benadryl and Compazine in the ED Continue home sumatriptan

## 2021-12-09 NOTE — Assessment & Plan Note (Signed)
-   As needed albuterol 

## 2021-12-09 NOTE — Progress Notes (Signed)
PHARMACIST - PHYSICIAN COMMUNICATION  CONCERNING:  Enoxaparin (Lovenox) for DVT Prophylaxis    RECOMMENDATION: Patient was prescribed enoxaprin 40mg  q24 hours for VTE prophylaxis.   Filed Weights   12/09/21 2059  Weight: 90.3 kg (199 lb)    Body mass index is 37.6 kg/m.  Estimated Creatinine Clearance: 88.9 mL/min (by C-G formula based on SCr of 0.8 mg/dL).   Based on [place] patient is candidate for enoxaparin 0.5mg /kg TBW SQ every 24 hours based on BMI being >30.  DESCRIPTION: Pharmacy has adjusted enoxaparin dose per [facility] policy.  Patient is now receiving enoxaparin 0.5 mg/kg every 24 hours   Renda Rolls, PharmD, [facility] 12/09/2021 10:59 PM

## 2021-12-09 NOTE — ED Notes (Signed)
Called [facility] for Code Stroke

## 2021-12-09 NOTE — Consult Note (Signed)
Initial Consultation Note   [facility] Tele[facility]urology Consult Services   Patient Name:   Ariele, Vidrio Date of Birth:   10/11/1976 Identification Number:   MRN - [id number] Date of Service:   12/09/2021 21:15:39  Diagnosis:       R20.2 - Paresthesia of skin  Impression:      migratory numbness/tingling involving the 2nd and 3rd digits of the left hand, then migrating to the other finger and then to the left face, followed by a headache. Symptoms are mostly resolved, except she continues to have a headache and very mild left facial numbness and LUE numbness on the exam (NIHSSS 1). Differential includes right hemispheric stroke versus complicated migraine, given the migratory nature of the symptoms. We discussed the possibility of thrombolytic, including the risk of bleeding. Given her NIHSS of 1 for very mild numbness that is not disabling, it was felt that the risks of thrombolytics would outweigh the benefit, and the patient agreed, and therefore, she deferred treatment. The 4.5-hour time window as discussed, so if her symptoms worsened/returned or she developed new symptoms, she was instructed to let the on-ground staff know immediately so they could call another SA and we could reassess.  Our recommendations are outlined below.  Recommendations:        Stroke/Telemetry Floor       Neuro Checks       Bedside Swallow Eval       DVT Prophylaxis       IV Fluids, Normal Saline       Head of Bed 30 Degrees       Euglycemia and Avoid Hyperthermia (PRN Acetaminophen)       Initiate or continue Aspirin 325 MG daily       Antihypertensives PRN if Blood pressure is greater than 220/120 or there is a concern for End organ damage/contraindications for permissive HTN. If blood pressure is greater than 220/120 give labetalol PO or IV or Vasotec IV with a goal of 15% reduction in BP during the first 24 hours.  Routine Consultation with [facility] for Follow up  Care  Sign Out:       Discussed with Emergency Department Provider    ------------------------------------------------------------------------------  Advanced Imaging: Advanced Imaging Not Completed because:  NIHSS < 6 with no cortical findings and history/exam not consistent with LVO   Metrics: Last Known Well: 12/09/2021 20:00:00 [facility] Notification Time: 12/09/2021 21:15:39 Arrival Time: 12/09/2021 20:42:00 Stamp Time: 12/09/2021 21:15:39 Initial Response Time: 12/09/2021 21:25:18 Symptoms: left sided numbness/tingling. NIHSS Start Assessment Time: 12/09/2021 21:58:48 Patient is not a candidate for Thrombolytic. Thrombolytic Medical Decision: 12/09/2021 22:06:44 Patient was not deemed candidate for Thrombolytic because of following reasons: No disabling symptoms. Patient and/or Family refused.  CT head showed no acute hemorrhage or acute core infarct.  ED Physician notified of diagnostic impression and management plan on 12/09/2021 22:11:50    ------------------------------------------------------------------------------  History of Present Illness: Patient is a 46 year old Female.  Patient was brought by private transportation with symptoms of left sided numbness/tingling. 46 year old RHD woman with history of migraine who presents with left sided numbness/tingling. The symptoms began at 2000 while she while she was sitting on the floor working on her laptop. It began with numbness in the 2nd and 3rd digits of the left hand. It then spread to the other fingers and then went to the left sided of her face/mouth. She developed a headache 10-15 minutes after the numbness/tingling. Her symptoms have markedly improved except she  continues to have a headache and very mild numbness around the corner of her mouth at the left side. Currently, her symptoms are not disabling and would not interefere with her daily activities. She states in the past, she has had episodes opf  tingling in the feet that resolves when she changes position but nothing like this.    Past Medical History:      Hypertension      Diabetes Mellitus      There is no history of Stroke  Medications:  No Anticoagulant use  No Antiplatelet use Reviewed EMR for current medications  Allergies:  Reviewed  Social History: Smoking: No  Family History:  There is no family history of premature cerebrovascular disease pertinent to this consultation  ROS : 14 Points Review of Systems was performed and was negative except mentioned in HPI.  Past Surgical History: There Is No Surgical History Contributory To Todays Visit     Examination: BP(135/99), Pulse(67), Blood Glucose(106) 1A: Level of Consciousness - Alert; keenly responsive + 0 1B: Ask Month and Age - Both Questions Right + 0 1C: Blink Eyes & Squeeze Hands - Performs Both Tasks + 0 2: Test Horizontal Extraocular Movements - Normal + 0 3: Test Visual Fields - No Visual Loss + 0 4: Test Facial Palsy (Use Grimace if Obtunded) - Normal symmetry + 0 5A: Test Left Arm Motor Drift - Drift, but doesn't hit bed + 1 5B: Test Right Arm Motor Drift - No Drift for 10 Seconds + 0 6A: Test Left Leg Motor Drift - No Drift for 5 Seconds + 0 6B: Test Right Leg Motor Drift - No Drift for 5 Seconds + 0 7: Test Limb Ataxia (FNF/Heel-Shin) - No Ataxia + 0 8: Test Sensation - Normal; No sensory loss + 0 9: Test Language/Aphasia - Normal; No aphasia + 0 10: Test Dysarthria - Normal + 0 11: Test Extinction/Inattention - No abnormality + 0  NIHSS Score: 1   Pre-Morbid Modified Rankin Scale: 0 Points = No symptoms at all   Patient/Family was informed the Neurology Consult would occur via TeleHealth consult by way of interactive audio and video telecommunications and consented to receiving care in this manner.   Patient is being evaluated for possible acute neurologic impairment and high probability of imminent or life-threatening  deterioration. I spent total of 30 minutes providing care to this patient, including time for face to face visit via telemedicine, review of medical records, imaging studies and discussion of findings with providers, the patient and/or family.   Dr Uvaldo Bristle   [facility] [phone]   Case [id number]

## 2021-12-09 NOTE — ED Notes (Signed)
Pt back from CT - Tele Neuro RN present at the bedside.

## 2021-12-09 NOTE — H&P (Signed)
History and Physical    Patient: Jenna Cameron EXH:[id number] DOB: 1976/04/18 DOA: 12/09/2021 DOS: the patient was seen and examined on 12/10/2021 PCP: Sofie Hartigan, MD  Patient coming from: Home  Chief Complaint:  Chief Complaint  Patient presents with   Numbness    HPI: Jenna Cameron is a 46 y.o. female with medical history significant of HTN, migraine, asthma who presented to the ED as a code stroke after she developed left-sided numbness and tingling at 2000.  The symptoms are described as numbness in the left second and third digits that then spread to the hand then the left side of her face and mouth with subsequent development of a headache.  She was previously in her usual state of health.  Initial head CT was nonacute and she went on to have a teleneurology consult with no recommendation for tPA given low NIHS score.  By evaluation patient only had residual headache but the tingling had disappeared.  Hospitalist admission requested for stroke work-up  Independent review of ED data: BP 135/99 with otherwise normal vitals CBC, CMP normal except for blood glucose of 118 EtOH less than 10, pregnancy test negative UA unremarkable COVID and flu negative  EKG, personally interpreted: NSR at 64 with no acute ST-T wave changes Imaging: Negative head CT  Notes review: ED provider, triage notes, teleneurology notes with recommendations  Preadmission treatment ordered: Compazine and Benadryl given for headache.  Hospitalist consulted for admission   Review of Systems: As mentioned in the history of present illness. All other systems reviewed and are negative. Past Medical History:  Diagnosis Date   Asthma    in highschool   Iron (Fe) deficiency anemia    Irregular menses    Past Surgical History:  Procedure Laterality Date   CHROMOPERTUBATION N/A 06/15/2015   Procedure: CHROMOPERTUBATION;  Surgeon: Lorette Ang, MD;  Location: [facility] ORS;  Service: Gynecology;   Laterality: N/A;   DILATATION & CURETTAGE/HYSTEROSCOPY WITH MYOSURE N/A 06/15/2015   Procedure: DILATATION & CURETTAGE/HYSTEROSCOPY WITH MYOSURE;  Surgeon: Lorette Ang, MD;  Location: [facility] ORS;  Service: Gynecology;  Laterality: N/A;   LAPAROSCOPY N/A 06/15/2015   Procedure: LAPAROSCOPY DIAGNOSTIC;  Surgeon: Lorette Ang, MD;  Location: [facility] ORS;  Service: Gynecology;  Laterality: N/A;   UTERINE FIBROID SURGERY     x 2   Social History:  reports that she has never smoked. She does not have any smokeless tobacco history on file. She reports current alcohol use. No history on file for drug use.  Allergies  Allergen Reactions   Shellfish Allergy Shortness Of Breath    Other reaction(s): Unknown   Penicillins Rash    Family History  Problem Relation Age of Onset   Stroke Mother    Breast cancer Neg Hx     Prior to Admission medications   Medication Sig Start Date End Date Taking? Authorizing Provider  cetirizine (ZYRTEC) 10 MG tablet Take 10 mg by mouth daily.    [provider]  diazepam (VALIUM) 5 MG tablet Take 1 tablet (5 mg total) by mouth every 8 (eight) hours as needed for muscle spasms. Patient not taking: Reported on 10/13/2018 04/09/15   Earleen Newport, MD  fluticasone [facility]) 50 MCG/ACT nasal spray Place 1 spray into both nostrils daily.    [provider]  IRON PO Take 2 tablets by mouth daily.    [provider]  oxyCODONE-acetaminophen (PERCOCET) 10-325 MG per tablet Take 1 tablet by mouth every 4 (four)  hours as needed for pain. Patient not taking: Reported on 10/13/2018 06/15/15   Lorette Ang, MD  Prenatal Vit-DSS-Fe Cbn-FA (PRENATAL AD) tablet Take 1 tablet by mouth daily.    [provider]  SUMAtriptan (IMITREX) 50 MG tablet Take 1 tablet (50 mg total) by mouth every 2 (two) hours as needed for migraine. May repeat in 2 hours if headache persists or recurs. 10/13/18 10/14/19  Drenda Freeze, MD   valsartan-hydrochlorothiazide (DIOVAN-HCT) 320-12.5 MG tablet Take 1 tablet by mouth daily. 09/10/21   [provider]    Physical Exam: Vitals:   12/09/21 2100 12/09/21 2130 12/09/21 2206 12/09/21 2257  BP: (!) 135/99 (!) 152/77 (!) 156/93 (!) 136/97  Pulse: 67 (!) 59 65 (!) 58  Resp: 18 15 19 16   Temp: 98.5 F (36.9 C)   97.8 F (36.6 C)  TempSrc: Oral     SpO2: 99% 100% 100% 98%  Weight:      Height:       Physical Exam Vitals and nursing note reviewed.  Constitutional:      Appearance: Normal appearance.  HENT:     Head: Normocephalic and atraumatic.  Cardiovascular:     Rate and Rhythm: Normal rate and regular rhythm.     Pulses: Normal pulses.  Pulmonary:     Effort: No accessory muscle usage or respiratory distress.     Breath sounds: No wheezing or rhonchi.  Abdominal:     General: Bowel sounds are normal.     Palpations: Abdomen is soft.     Tenderness: There is no abdominal tenderness.  Musculoskeletal:        General: No swelling or tenderness. Normal range of motion.     Cervical back: Normal range of motion and neck supple.  Skin:    General: Skin is warm and dry.  Neurological:     General: No focal deficit present.     Mental Status: She is alert. Mental status is at baseline.  Psychiatric:        Mood and Affect: Mood normal.        Behavior: Behavior normal.     Data Reviewed: Notes from primary care and specialist visits, past discharge summaries. Prior diagnostic testing as applicable to current admission diagnoses Updated medications and problem lists for reconciliation ED course, including vitals, labs, imaging, treatment and response to treatment Triage notes and ED providers notes   Assessment and Plan: * Transient neurologic deficit- (present on admission) Etiology related either to stroke or complex migraine per teleneurology Stroke work-up as recommended by teleneurology to include:       Stroke/Telemetry Floor        Neuro Checks       Bedside Swallow Eval       DVT Prophylaxis       IV Fluids, Normal Saline       Head of Bed 30 Degrees       Euglycemia and Avoid Hyperthermia (PRN Acetaminophen)       Initiate or continue Aspirin 325 MG daily       Antihypertensives PRN if Blood pressure is greater than 220/120 or there is a concern for End organ damage/contraindications for permissive HTN. If blood pressure is greater than 220/120 give labetalol PO or IV or Vasotec IV with a goal of 15% reduction in BP during the first 24 hours.   Routine Consultation with [facility] for Follow up Care  Essential hypertension- (present on admission) Hold home Diovan/HCTZ for  permissive hypertension  Migraine Treated with IV Benadryl and Compazine in the ED Continue home sumatriptan  Asthma As needed albuterol   Advance Care Planning:   Code Status: Full Code full  Consults: neurology  Family Communication: none  Severity of Illness: The appropriate patient status for this patient is OBSERVATION. Observation status is judged to be reasonable and necessary in order to provide the required intensity of service to ensure the patient's safety. The patient's presenting symptoms, physical exam findings, and initial radiographic and laboratory data in the context of their medical condition is felt to place them at decreased risk for further clinical deterioration. Furthermore, it is anticipated that the patient will be medically stable for discharge from the hospital within 2 midnights of admission.   Author: Athena Masse, MD 12/10/2021 12:16 AM  For on call review [email or web address].

## 2021-12-09 NOTE — Progress Notes (Signed)
Responded to code stroke. Patient was alert and oriented upon arrival. Able to carry meaningful conversation. Requested prayer. Spiritual care was provided according to patient's request.

## 2021-12-10 ENCOUNTER — Observation Stay: Payer: BC Managed Care – PPO

## 2021-12-10 ENCOUNTER — Observation Stay: Admit: 2021-12-10 | Payer: BC Managed Care – PPO

## 2021-12-10 ENCOUNTER — Encounter: Payer: Self-pay | Admitting: Internal Medicine

## 2021-12-10 ENCOUNTER — Observation Stay (HOSPITAL_BASED_OUTPATIENT_CLINIC_OR_DEPARTMENT_OTHER)
Admit: 2021-12-10 | Discharge: 2021-12-10 | Disposition: A | Discharge status: Home / Self Care | Payer: BC Managed Care – PPO | Attending: Internal Medicine | Admitting: Internal Medicine

## 2021-12-10 DIAGNOSIS — I10 Essential (primary) hypertension: Secondary | ICD-10-CM

## 2021-12-10 DIAGNOSIS — R29818 Other symptoms and signs involving the nervous system: Secondary | ICD-10-CM | POA: Diagnosis not present

## 2021-12-10 DIAGNOSIS — G43109 Migraine with aura, not intractable, without status migrainosus: Secondary | ICD-10-CM | POA: Diagnosis not present

## 2021-12-10 LAB — LIPID PANEL
Cholesterol: 204 mg/dL — ABNORMAL HIGH (ref 0–200)
HDL: 50 mg/dL (ref >40)
LDL Cholesterol: 132 mg/dL — ABNORMAL HIGH (ref 0–99)
Total CHOL/HDL Ratio: 4.1 RATIO
Triglycerides: 110 mg/dL (ref <150)
VLDL: 22 mg/dL (ref 0–40)

## 2021-12-10 LAB — ECHOCARDIOGRAM COMPLETE
AR max vel: 1.97 cm2
AV Area VTI: 1.91 cm2
AV Area mean vel: 1.72 cm2
AV Mean grad: 4 mmHg
AV Peak grad: 7 mmHg
Ao pk vel: 1.32 m/s
Area-P 1/2: 3.27 cm2
Height: 61 in
MV VTI: 2.29 cm2
S' Lateral: 2.42 cm
Weight: 3184 oz

## 2021-12-10 LAB — HEMOGLOBIN A1C
Hgb A1c MFr Bld: 5.6 % (ref 4.8–5.6)
Mean Plasma Glucose: 114.02 mg/dL

## 2021-12-10 LAB — HIV ANTIBODY (ROUTINE TESTING W REFLEX): HIV Screen 4th Generation wRfx: NONREACTIVE

## 2021-12-10 MED ORDER — ASPIRIN 325 MG PO TABS
325.0000 mg | ORAL_TABLET | Freq: Every day | ORAL | Status: DC
  Administered 2021-12-10: 325 mg via ORAL
  Filled 2021-12-10: qty 1

## 2021-12-10 MED ORDER — ATORVASTATIN CALCIUM 20 MG PO TABS
40.0000 mg | ORAL_TABLET | Freq: Every day | ORAL | Status: DC
  Administered 2021-12-10: 40 mg via ORAL
  Filled 2021-12-10: qty 2

## 2021-12-10 NOTE — Consult Note (Signed)
Neurology Consultation  Reason for Consult: Headache, strokelike symptoms Referring Physician: Dr. Mal Misty, [facility]  CC: Headache, numbness  History is obtained from: Patient, chart  HPI: Jenna Cameron is a 46 y.o. female past medical history of hypertension and prediabetes along with history of migraines, who presented to the emergency department yesterday, after she had a sudden onset of left-sided tingling and numbness and was evaluated by telemedicine neurology, admitted for observation for potential TIA versus complex migraine. 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  She had her mother take a look at her to make sure she had no facial asymmetry.  Very soon after within the next few minutes, she developed a headache.  Because of the neurological symptoms and the headache, she came to the emergency room and was evaluated as an acute code stroke-presenting with focal symptoms. No tPA was given due to symptoms being mild and resolving. At this time, she has no headache or tingling numbness or weakness. She did mention that she was on nortriptyline for headache prevention at some time but with her blood pressures being better controlled afterwards, her headaches improved and she was off of nortriptyline.  She was able to describe what sounded like a visual aura when she had the headache and when I showed her some pictures off of Google search of what a migrainous visual aura looks like she was able to point to quite a few of them being similar to what she felt. I ordered an MRI in the morning after her chart review which was completed and I have personally reviewed-no acute findings.  Also regarding her headaches, she gets very infrequent headaches these days, mostly around her menstrual cycle.   LKW: 8 p.m.  yesterday tpa given?: no, decision made by telemedicine neurology on initial evaluation did not get thrombolysis due to mild symptoms and rapidly improving symptoms with more concerning for complex migraine and stroke Premorbid modified Rankin scale (mRS): 0  ROS: Full ROS was performed and is negative except as noted in the HPI.   Past Medical History:  Diagnosis Date   Asthma    in highschool   Iron (Fe) deficiency anemia    Irregular menses    Family History  Problem Relation Age of Onset   Stroke Mother    Breast cancer Neg Hx      Social History:   reports that she has never smoked. She does not have any smokeless tobacco history on file. She reports current alcohol use. No history on file for drug use.  Medications  Current Facility-Administered Medications:    acetaminophen (TYLENOL) tablet 650 mg, 650 mg, Oral, Q4H PRN **OR** acetaminophen (TYLENOL) 160 MG/5ML solution 650 mg, 650 mg, Per Tube, Q4H PRN **OR** acetaminophen (TYLENOL) suppository 650 mg, 650 mg, Rectal, Q4H PRN, Athena Masse, MD   aspirin tablet 325 mg, 325 mg, Oral, Daily, Judd Gaudier V, MD, 325 mg at 12/10/21 0810   atorvastatin (LIPITOR) tablet 40 mg, 40 mg, Oral, Daily, Judd Gaudier V, MD, 40 mg at 12/10/21 0810   enoxaparin (LOVENOX) injection 45 mg, 0.5 mg/kg, Subcutaneous, Q24H, Athena Masse, MD   Exam: Current vital signs: BP 101/68 (BP Location: Right Arm)    Pulse 66    Temp 98.2 F (36.8 C) (Oral)  Resp 16    Ht 5\' 1"  (1.549 m)    Wt 90.3 kg    LMP 12/06/2021 (Exact Date)    SpO2 97%    BMI 37.60 kg/m  Vital signs in last 24 hours: Temp:  [97.8 F (36.6 C)-98.5 F (36.9 C)] 98.2 F (36.8 C) (02/07 0738) Pulse Rate:  [58-68] 66 (02/07 0738) Resp:  [15-19] 16 (02/07 0738) BP: (95-156)/(57-99) 101/68 (02/07 0738) SpO2:  [96 %-100 %] 97 % (02/07 0738) Weight:  [90.3 kg] 90.3 kg (02/06 2059)  GENERAL: Awake, alert in NAD HEENT: - Normocephalic and atraumatic, dry mm, no LN++, no  Thyromegally LUNGS - Clear to auscultation bilaterally with no wheezes CV - S1S2 RRR, no m/r/g, equal pulses bilaterally. ABDOMEN - Soft, nontender, nondistended with normoactive BS Ext: warm, well perfused, intact peripheral pulses, no edema  NEURO:  Mental Status: AA&Ox3  Language: speech is clear and nondysarthric.  Naming, repetition, fluency, and comprehension intact. Cranial Nerves: PERRL. EOMI, visual fields full, no facial asymmetry, facial sensation intact, hearing intact, tongue/uvula/soft palate midline, normal sternocleidomastoid and trapezius muscle strength. No evidence of tongue atrophy or fibrillations Motor: 5/5 without drift in all fours Tone: is normal and bulk is normal Sensation- Intact to light touch bilaterally, no extinction Coordination: FTN intact bilaterally, no ataxia in BLE. Gait- deferred DTR: 2+ in the knees bilaterally, diminished brachioradialis bilaterally.  NIHSS 0   Labs I have reviewed labs in epic and the results pertinent to this consultation are:   CBC    Component Value Date/Time   WBC 5.0 12/09/2021 2122   RBC 4.79 12/09/2021 2122   HGB 12.5 12/09/2021 2122   HCT 38.3 12/09/2021 2122   PLT 229 12/09/2021 2122   MCV 80.0 12/09/2021 2122   MCH 26.1 12/09/2021 2122   MCHC 32.6 12/09/2021 2122   RDW 13.7 12/09/2021 2122   LYMPHSABS 1.9 12/09/2021 2122   MONOABS 0.2 12/09/2021 2122   EOSABS 0.2 12/09/2021 2122   BASOSABS 0.0 12/09/2021 2122    CMP     Component Value Date/Time   NA 138 12/09/2021 2122   K 3.7 12/09/2021 2122   CL 101 12/09/2021 2122   CO2 29 12/09/2021 2122   GLUCOSE 118 (H) 12/09/2021 2122   BUN 13 12/09/2021 2122   CREATININE 0.80 12/09/2021 2122   CALCIUM 9.5 12/09/2021 2122   PROT 7.7 12/09/2021 2122   ALBUMIN 4.3 12/09/2021 2122   AST 22 12/09/2021 2122   ALT 17 12/09/2021 2122   ALKPHOS 47 12/09/2021 2122   BILITOT 0.6 12/09/2021 2122   GFRNONAA >60 12/09/2021 2122   GFRAA >60 10/13/2018 0903     Lipid Panel     Component Value Date/Time   CHOL 204 (H) 12/10/2021 0458   TRIG 110 12/10/2021 0458   HDL 50 12/10/2021 0458   CHOLHDL 4.1 12/10/2021 0458   VLDL 22 12/10/2021 0458   LDLCALC 132 (H) 12/10/2021 0458     Imaging I have reviewed the images obtained: CT head code stroke-no acute changes MRI brain-no acute changes   Assessment: 46 year old presenting with sudden onset of left-sided tingling and numbness followed by a headache with visual aura.  Symptoms most likely consistent with a complex migraine.  Brain imaging in the form of MRI of the brain without contrast is negative for stroke or acute abnormality. Likely etiology of presentation-complex migraine. Does not get migraines very frequently, I did discuss preventive therapies and abortive treatment including triptans.  I would defer the use of  those after an outpatient headache consultation with headache neurology.  Impression: Complex migraine  Recommendations: Tylenol as needed for headache Outpatient neurology follow-up-follows with [facility] clinic, would recommend giving her referral for [facility] neurology for consideration of abortive and preventative headache treatments.  We discussed triptans but at this point she does not think she needs a prescription for sumatriptan.  Further discussions on outpatient follow-up. No need for antiplatelets unless indicated for cardiac reason-we will defer to outpatient primary care. Follow-up with outpatient neurology in 4 to 6 weeks. Plan relayed to the hospitalist via secure chat. Inpatient neurology will be available as needed.   -- Amie Portland, MD Neurologist [facility] Pager: [phone]

## 2021-12-10 NOTE — Plan of Care (Signed)
°  Problem: Education: Goal: Knowledge of General Education information will improve Description: Including pain rating scale, medication(s)/side effects and non-pharmacologic comfort measures Outcome: Progressing   Problem: Health Behavior/Discharge Planning: Goal: Ability to manage health-related needs will improve Outcome: Progressing   Problem: Education: Goal: Knowledge of disease or condition will improve Outcome: Progressing Goal: Knowledge of secondary prevention will improve (SELECT ALL) Outcome: Progressing Goal: Knowledge of patient specific risk factors will improve (INDIVIDUALIZE FOR PATIENT) Outcome: Progressing

## 2021-12-10 NOTE — Discharge Summary (Signed)
Physician Discharge Summary  Jenna Cameron TKP:[id number] DOB: Apr 25, 1976 DOA: 12/09/2021  PCP: Sofie Hartigan, MD  Admit date: 12/09/2021 Discharge date: 12/10/2021  Discharge disposition: Home   Recommendations for Outpatient Follow-Up:   Follow-up with PCP in 1 to 2 weeks Follow-up with neurologist at [facility] clinic in 4 weeks   Discharge Diagnosis:   Principal Problem:   Transient neurologic deficit Active Problems:   Asthma   Migraine   Essential hypertension    Discharge Condition: Stable.  Diet recommendation:  Diet Order             Diet Heart Room service appropriate? Yes; Fluid consistency: Thin  Diet effective now           Diet - low sodium heart healthy                     Code Status: Full Code     Hospital Course:   Ms. Jenna Cameron is a 46 year old woman with medical history significant for hypertension, migraine, asthma, obesity, who presented to the hospital because of numbness and tingling in her left hand and the left side of her face.  Symptoms were brief and lasted about 15 minutes.  She was admitted to the hospital for observation.  CT head and MRI brain did not show any evidence of acute stroke.  She was evaluated by the neurologist.  Her symptoms were suspected to be from complex migraine.  She is completely asymptomatic and deemed stable for discharge to home.  Discharge plan was discussed with the patient and her friend who was on speaker phone.    Medical Consultants:   Neurologist   Discharge Exam:    Vitals:   12/09/21 2257 12/10/21 0444 12/10/21 0738 12/10/21 1150  BP: (!) 136/97 (!) 95/57 101/68 104/63  Pulse: (!) 58 68 66 62  Resp: 16  16 16   Temp:  98 F (36.7 C) 98.2 F (36.8 C) 98 F (36.7 C)  TempSrc:  Oral Oral   SpO2: 98% 96% 97% 99%  Weight:      Height:         GEN: NAD SKIN: Warm and dry EYES: EOMI ENT: MMM CV: RRR PULM: CTA B ABD: soft, obese, NT, +BS CNS: AAO x 3, non focal EXT:  No edema or tenderness   The results of significant diagnostics from this hospitalization (including imaging, microbiology, ancillary and laboratory) are listed below for reference.     Procedures and Diagnostic Studies:   MR BRAIN WO CONTRAST  Result Date: 12/10/2021 CLINICAL DATA:  Neuro deficit, acute, stroke suspected; code stroke follow-up for left-sided numbness EXAM: MRI HEAD WITHOUT CONTRAST TECHNIQUE: Multiplanar, multiecho pulse sequences of the brain and surrounding structures were obtained without intravenous contrast. COMPARISON:  2019 FINDINGS: Brain: There is no acute infarction or intracranial hemorrhage. There is no intracranial mass, mass effect, or edema. There is no hydrocephalus or extra-axial fluid collection. Ventricles and sulci are normal in size and configuration. Vascular: Major vessel flow voids at the skull base are preserved. Skull and upper cervical spine: Normal marrow signal is preserved. Sinuses/Orbits: Trace mucosal thickening.  Orbits are unremarkable. Other: Sella is unremarkable.  Mastoid air cells are clear. IMPRESSION: No acute infarct, hemorrhage, or mass. Electronically Signed   By: Macy Mis M.D.   On: 12/10/2021 09:09     Labs:   Basic Metabolic Panel: Recent Labs  Lab 12/09/21 2122  NA 138  K 3.7  CL 101  CO2  29  GLUCOSE 118*  BUN 13  CREATININE 0.80  CALCIUM 9.5   GFR Estimated Creatinine Clearance: 88.9 mL/min (by C-G formula based on SCr of 0.8 mg/dL). Liver Function Tests: Recent Labs  Lab 12/09/21 2122  AST 22  ALT 17  ALKPHOS 47  BILITOT 0.6  PROT 7.7  ALBUMIN 4.3   No results for input(s): LIPASE, AMYLASE in the last 168 hours. No results for input(s): AMMONIA in the last 168 hours. Coagulation profile Recent Labs  Lab 12/09/21 2122  INR 0.9    CBC: Recent Labs  Lab 12/09/21 2122  WBC 5.0  NEUTROABS 2.7  HGB 12.5  HCT 38.3  MCV 80.0  PLT 229   Cardiac Enzymes: No results for input(s): CKTOTAL, CKMB,  CKMBINDEX, TROPONINI in the last 168 hours. BNP: Invalid input(s): POCBNP CBG: No results for input(s): GLUCAP in the last 168 hours. D-Dimer No results for input(s): DDIMER in the last 72 hours. Hgb A1c Recent Labs    12/10/21 0458  HGBA1C 5.6   Lipid Profile Recent Labs    12/10/21 0458  CHOL 204*  HDL 50  LDLCALC 132*  TRIG 110  CHOLHDL 4.1   Thyroid function studies No results for input(s): TSH, T4TOTAL, T3FREE, THYROIDAB in the last 72 hours.  Invalid input(s): FREET3 Anemia work up No results for input(s): VITAMINB12, FOLATE, FERRITIN, TIBC, IRON, RETICCTPCT in the last 72 hours. Microbiology Recent Results (from the past 240 hour(s))  Resp Panel by RT-PCR (Flu A&B, Covid) Nasopharyngeal Swab     Status: None   Collection Time: 12/09/21  9:22 PM   Specimen: Nasopharyngeal Swab; Nasopharyngeal(NP) swabs in vial transport medium  Result Value Ref Range Status   SARS Coronavirus 2 by RT PCR NEGATIVE NEGATIVE Final    Comment: (NOTE) SARS-CoV-2 target nucleic acids are NOT DETECTED.  The SARS-CoV-2 RNA is generally detectable in upper respiratory specimens during the acute phase of infection. The lowest concentration of SARS-CoV-2 viral copies this assay can detect is 138 copies/mL. A negative result does not preclude SARS-Cov-2 infection and should not be used as the sole basis for treatment or other patient management decisions. A negative result may occur with  improper specimen collection/handling, submission of specimen other than nasopharyngeal swab, presence of viral mutation(s) within the areas targeted by this assay, and inadequate number of viral copies(<138 copies/mL). A negative result must be combined with clinical observations, patient history, and epidemiological information. The expected result is Negative.  Fact Sheet for Patients:  EntrepreneurPulse.com.au  Fact Sheet for Healthcare Providers:   IncredibleEmployment.be  This test is no t yet approved or cleared by the [place] FDA and  has been authorized for detection and/or diagnosis of SARS-CoV-2 by FDA under an Emergency Use Authorization (EUA). This EUA will remain  in effect (meaning this test can be used) for the duration of the COVID-19 declaration under Section 564(b)(1) of the Act, 21 U.S.C.section 360bbb-3(b)(1), unless the authorization is terminated  or revoked sooner.       Influenza A by PCR NEGATIVE NEGATIVE Final   Influenza B by PCR NEGATIVE NEGATIVE Final    Comment: (NOTE) The Xpert Xpress SARS-CoV-2/FLU/RSV plus assay is intended as an aid in the diagnosis of influenza from Nasopharyngeal swab specimens and should not be used as a sole basis for treatment. Nasal washings and aspirates are unacceptable for Xpert Xpress SARS-CoV-2/FLU/RSV testing.  Fact Sheet for Patients: EntrepreneurPulse.com.au  Fact Sheet for Healthcare Providers: IncredibleEmployment.be  This test is not yet approved or  cleared by the [place] and has been authorized for detection and/or diagnosis of SARS-CoV-2 by FDA under an Emergency Use Authorization (EUA). This EUA will remain in effect (meaning this test can be used) for the duration of the COVID-19 declaration under Section 564(b)(1) of the Act, 21 U.S.C. section 360bbb-3(b)(1), unless the authorization is terminated or revoked.  Performed at [facility], [address] [place], [place] [address]      Discharge Instructions:   Discharge Instructions     Diet - low sodium heart healthy   Complete by: As directed    Increase activity slowly   Complete by: As directed       Allergies as of 12/10/2021       Reactions   Shellfish Allergy Shortness Of Breath   Other reaction(s): Unknown   Penicillins Rash        Medication List     STOP taking these medications    diazepam  5 MG tablet Commonly known as: Valium   oxyCODONE-acetaminophen 10-325 MG tablet Commonly known as: Percocet   SUMAtriptan 50 MG tablet Commonly known as: Imitrex       TAKE these medications    cyanocobalamin 1000 MCG tablet Take 1,000 mcg by mouth daily.   ferrous sulfate 325 (65 FE) MG tablet Take 325 mg by mouth daily.   fluticasone 50 MCG/ACT nasal spray Commonly known as: FLONASE Place 1 spray into both nostrils daily.   Melatonin 5 MG Chew Chew 5 mg by mouth daily.   valsartan-hydrochlorothiazide 320-12.5 MG tablet Commonly known as: DIOVAN-HCT Take 1 tablet by mouth daily.        Follow-up Information     [facility]. Schedule an appointment as soon as possible for a visit in 1 month(s).   Contact information: [place] [phone]                  If you experience worsening of your admission symptoms, develop shortness of breath, life threatening emergency, suicidal or homicidal thoughts you must seek medical attention immediately by calling 911 or calling your MD immediately  if symptoms less severe.   You must read complete instructions/literature along with all the possible adverse reactions/side effects for all the medicines you take and that have been prescribed to you. Take any new medicines after you have completely understood and accept all the possible adverse reactions/side effects.    Please note   You were cared for by a hospitalist during your hospital stay. If you have any questions about your discharge medications or the care you received while you were in the hospital after you are discharged, you can call the unit and asked to speak with the hospitalist on call if the hospitalist that took care of you is not available. Once you are discharged, your primary care physician will handle any further medical issues. Please note that NO REFILLS for any discharge  medications will be authorized once you are discharged, as it is imperative that you return to your primary care physician (or establish a relationship with a primary care physician if you do not have one) for your aftercare needs so that they can reassess your need for medications and monitor your lab values.       Time coordinating discharge: Greater than 30 minutes  Signed:  Tashiba Timoney  [facility] 12/10/2021, 2:03 PM   Pager on [email or web address]. If 7PM-7AM, please contact night-coverage at [email or web address]

## 2021-12-10 NOTE — Progress Notes (Signed)
OT Cancellation Note  Patient Details Name: Jenna Cameron MRN: [id number] DOB: 1976/11/02   Cancelled Treatment:    Reason Eval/Treat Not Completed: OT screened, no needs identified, will sign off. Pt reports being back to baseline with self care tasks and mobility. She also reports symptoms have resolved.   Darleen Crocker, MS, OTR/L , CBIS ascom [phone]  12/10/21, 10:04 AM

## 2021-12-10 NOTE — Progress Notes (Signed)
PT Cancellation Note  Patient Details Name: Jenna Cameron MRN: [id number] DOB: 07-Mar-1976   Cancelled Treatment:    Reason Eval/Treat Not Completed: PT screened, no needs identified, will sign off. Patient reports no PT needs at this time, feels like she is at her baseline.   Minna Merritts, PT, MPT  [facility]ocus 12/10/2021, 11:41 AM

## 2021-12-10 NOTE — Progress Notes (Signed)
Jenna Cameron to be D/C'd Home per MD order.  Discussed prescriptions and follow up appointments with the patient. Prescriptions given to patient, medication list explained in detail. Pt verbalized understanding.  Allergies as of 12/10/2021       Reactions   Shellfish Allergy Shortness Of Breath   Other reaction(s): Unknown   Penicillins Rash        Medication List     STOP taking these medications    diazepam 5 MG tablet Commonly known as: Valium   oxyCODONE-acetaminophen 10-325 MG tablet Commonly known as: Percocet   SUMAtriptan 50 MG tablet Commonly known as: Imitrex       TAKE these medications    cyanocobalamin 1000 MCG tablet Take 1,000 mcg by mouth daily.   ferrous sulfate 325 (65 FE) MG tablet Take 325 mg by mouth daily.   fluticasone 50 MCG/ACT nasal spray Commonly known as: FLONASE Place 1 spray into both nostrils daily.   Melatonin 5 MG Chew Chew 5 mg by mouth daily.   valsartan-hydrochlorothiazide 320-12.5 MG tablet Commonly known as: DIOVAN-HCT Take 1 tablet by mouth daily.        Vitals:   12/10/21 0738 12/10/21 1150  BP: 101/68 104/63  Pulse: 66 62  Resp: 16 16  Temp: 98.2 F (36.8 C) 98 F (36.7 C)  SpO2: 97% 99%    Skin clean, dry and intact without evidence of skin break down, no evidence of skin tears noted. IV catheter discontinued intact. Site without signs and symptoms of complications. Dressing and pressure applied. Pt denies pain at this time. No complaints noted.  An After Visit Summary was printed and given to the patient. Patient escorted via [place], and D/C home via private auto.  Jenna Cameron

## 2021-12-10 NOTE — Progress Notes (Signed)
*  PRELIMINARY RESULTS* Echocardiogram 2D Echocardiogram has been performed.  Jenna Cameron 12/10/2021, 11:36 AM

## 2021-12-10 NOTE — Progress Notes (Signed)
SLP Cancellation Note  Patient Details Name: Jenna Cameron MRN: [id number] DOB: Aug 18, 1976   Cancelled treatment:       Reason Eval/Treat Not Completed: SLP screened, no needs identified, will sign off (chart reviewed; consulted pt/NSG) Pt denied any difficulty swallowing and is currently on a regular diet; tolerates swallowing pills w/ water per NSG. Pt conversed in conversation w/out overt expressive/receptive deficits noted; pt denied any speech-language deficits. Speech clear. Pt answered phone, read and chose menu items for lunch meal. Laughing and talking; pleasant.  No further skilled ST services indicated as pt appears at her baseline. Pt agreed. NSG to reconsult if any change in status while admitted.       Orinda Kenner, MS, CCC-SLP Speech Language Pathologist Re[facility]; [place] [phone] (ascom) Jenna Cameron 12/10/2021, 11:00 AM

## 2021-12-16 ENCOUNTER — Other Ambulatory Visit: Payer: Self-pay | Admitting: Family Medicine

## 2021-12-16 DIAGNOSIS — Z1231 Encounter for screening mammogram for malignant neoplasm of breast: Secondary | ICD-10-CM

## 2022-05-21 ENCOUNTER — Ambulatory Visit: Payer: BC Managed Care – PPO

## 2022-05-21 ENCOUNTER — Ambulatory Visit
Admission: RE | Admit: 2022-05-21 | Discharge: 2022-05-21 | Disposition: A | Discharge status: Home / Self Care | Payer: BC Managed Care – PPO | Source: Ambulatory Visit | Attending: Family Medicine | Admitting: Family Medicine

## 2022-05-21 DIAGNOSIS — Z1231 Encounter for screening mammogram for malignant neoplasm of breast: Secondary | ICD-10-CM | POA: Diagnosis present

## 2023-05-01 ENCOUNTER — Other Ambulatory Visit: Payer: Self-pay

## 2023-05-01 DIAGNOSIS — Z1231 Encounter for screening mammogram for malignant neoplasm of breast: Secondary | ICD-10-CM

## 2023-05-26 ENCOUNTER — Ambulatory Visit
Admission: RE | Admit: 2023-05-26 | Discharge: 2023-05-26 | Disposition: A | Discharge status: Home / Self Care | Payer: BC Managed Care – PPO | Source: Ambulatory Visit | Attending: Family Medicine | Admitting: Family Medicine

## 2023-05-26 DIAGNOSIS — Z1231 Encounter for screening mammogram for malignant neoplasm of breast: Secondary | ICD-10-CM | POA: Insufficient documentation

## 2023-08-26 IMAGING — MR MR HEAD W/O CM
12 series · 48 of 48 positions shown · non-contrast
Comparison: 2406

CLINICAL DATA: Neuro deficit, acute, stroke suspected; code stroke
follow-up for left-sided numbness

EXAM:
MRI HEAD WITHOUT CONTRAST
TECHNIQUE: Multiplanar, multiecho pulse sequences of the brain and surrounding
structures were obtained without intravenous contrast.

[Series 5: ax dwi_tracew · axial · 3.0mm · 0.71mm/px · z∈[-92,+73]mm · 4 of 56 slices shown]
[im 1/56]
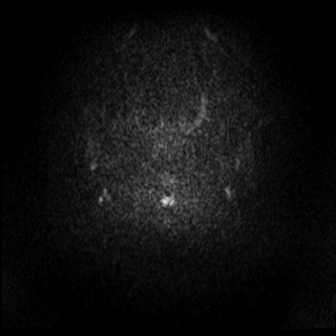
[im 19/56]
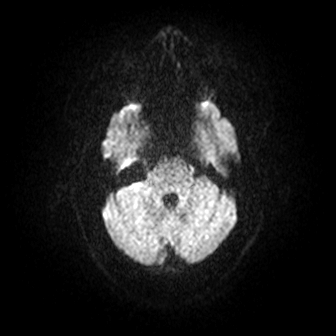
[im 37/56]
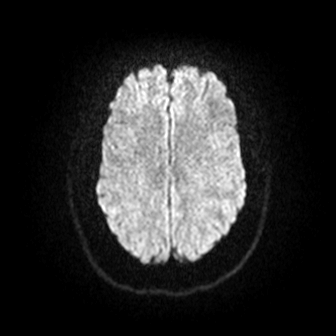
[im 56/56]
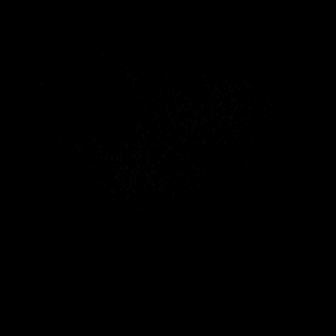

[Series 6: ax dwi_adc · axial · 3.0mm · 0.71mm/px · z∈[-92,+70]mm · 4 of 55 slices shown]
[im 1/55]
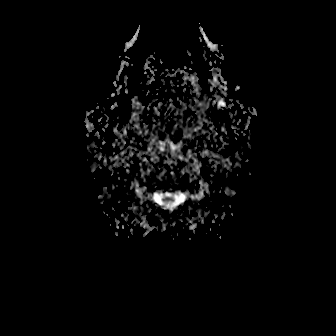
[im 19/55]
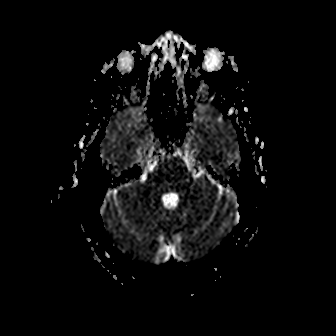
[im 37/55]
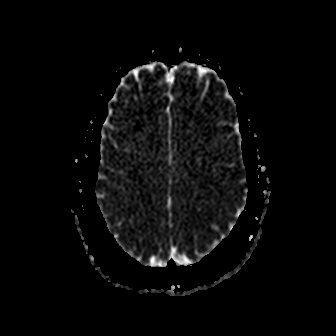
[im 55/55]
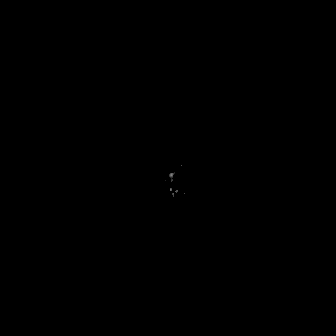

[Series 7: cor dwi_tracew · coronal · 5.0mm · 0.68mm/px · 3 of 40 slices shown]
[im 1/40]
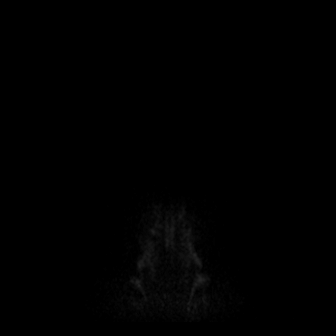
[im 20/40]
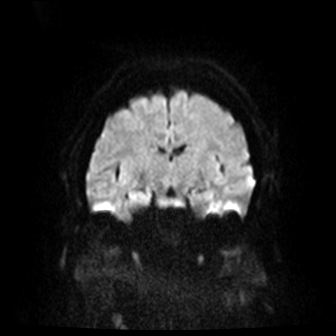
[im 40/40]
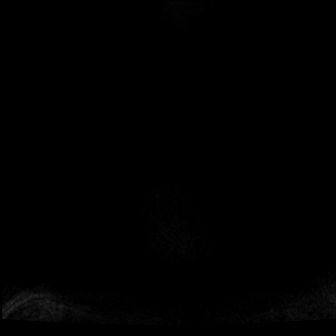

[Series 8: cor dwi_adc · coronal · 5.0mm · 0.68mm/px · 3 of 40 slices shown]
[im 1/40]
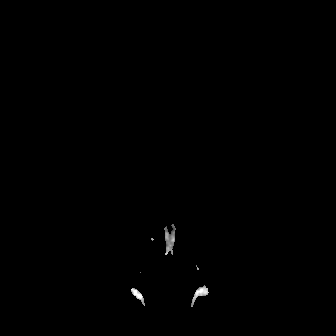
[im 20/40]
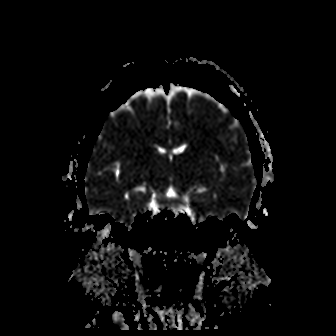
[im 40/40]
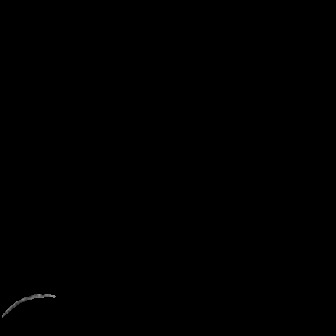

[Series 9: T1 · sagittal · 5.0mm · 0.62mm/px · 2 of 23 slices shown (1 of 2)]
[im 1/23]
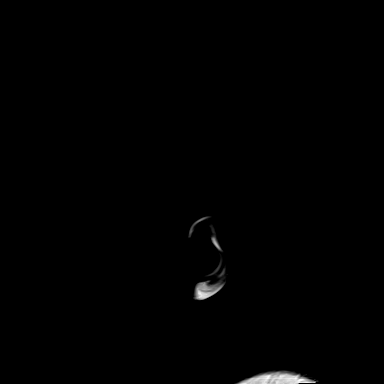
[im 23/23]
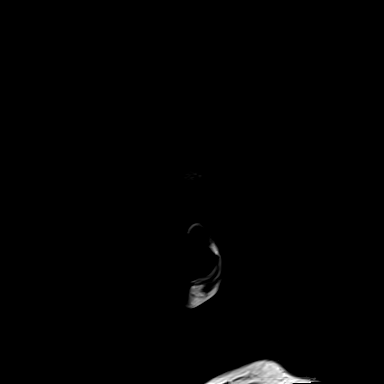

[Series 10: T2 · axial · 5.0mm · 0.53mm/px · z∈[-84,+66]mm · 2 of 26 slices shown (1 of 2)]
[im 1/26]
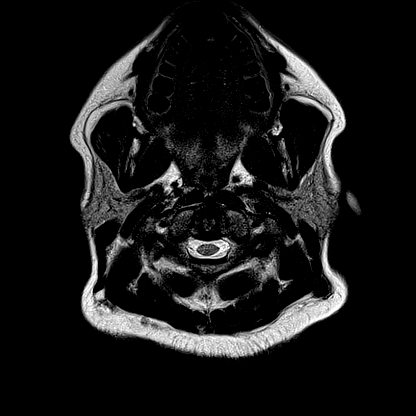
[im 26/26]
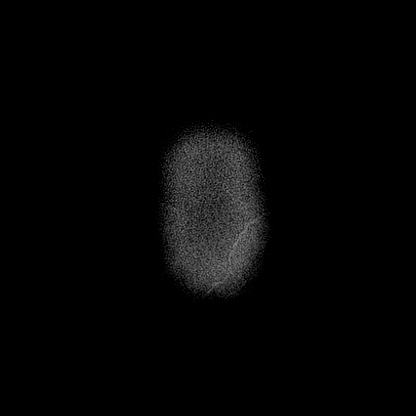

[Series 11: mag_images · axial · 3.0mm · 0.90mm/px · z∈[-98,+79]mm · 4 of 60 slices shown]
[im 1/60]
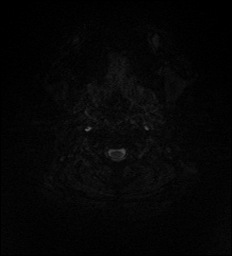
[im 20/60]
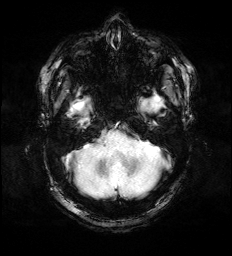
[im 40/60]
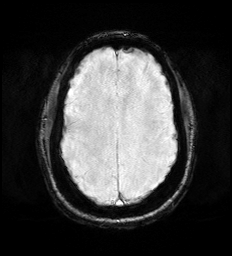
[im 60/60]
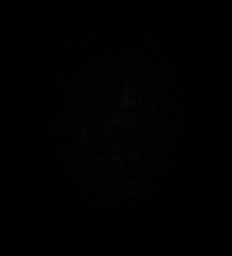

[Series 12: pha_images · axial · 3.0mm · 0.90mm/px · z∈[-98,+79]mm · 4 of 58 slices shown]
[im 1/58]
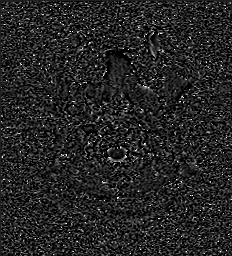
[im 20/58]
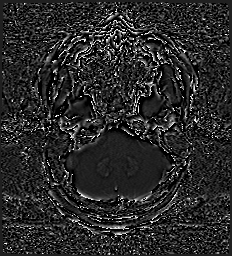
[im 39/58]
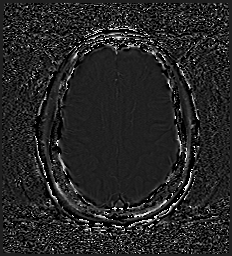
[im 58/58]
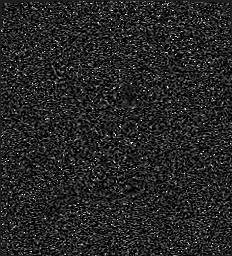

[Series 13: swi_images · axial · 3.0mm · 0.90mm/px · z∈[-98,+79]mm · 4 of 60 slices shown]
[im 1/60]
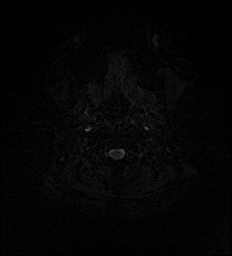
[im 20/60]
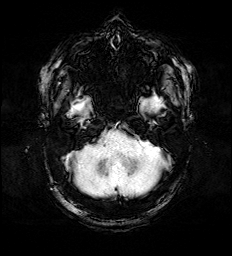
[im 40/60]
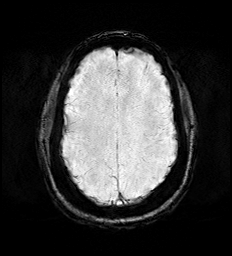
[im 60/60]
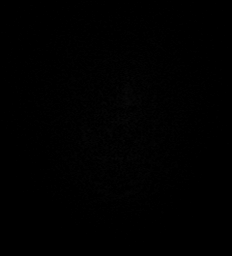

[Series 15: FLAIR · axial · 3.0mm · 0.53mm/px · z∈[-90,+72]mm · 4 of 55 slices shown]
[im 1/55]
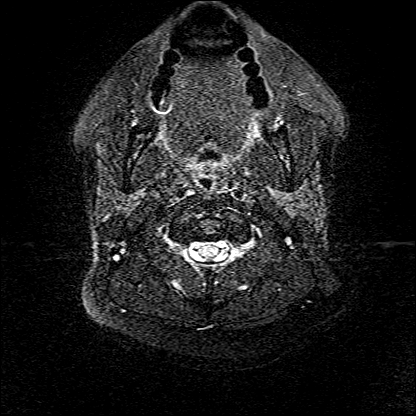
[im 19/55]
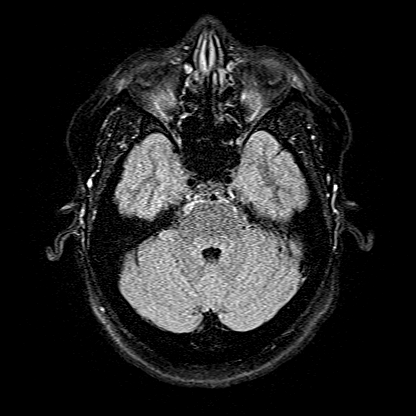
[im 37/55]
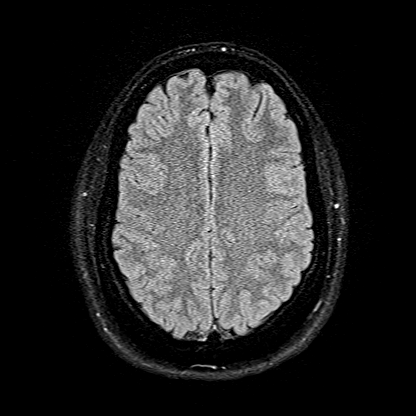
[im 55/55]
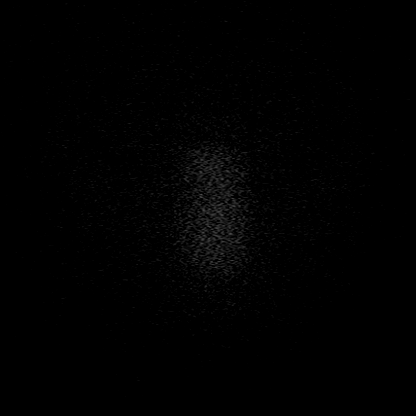

[Series 16: T1 · axial · 1.0mm · 0.98mm/px · z∈[-92,+83]mm · 12 of 174 slices shown (2 of 2)]
[im 1/174]
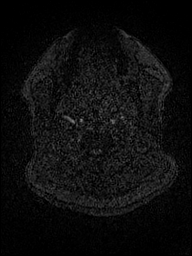
[im 16/174]
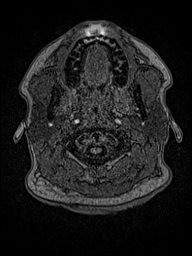
[im 32/174]
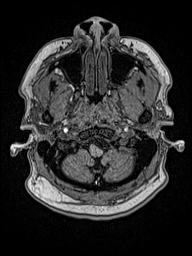
[im 48/174]
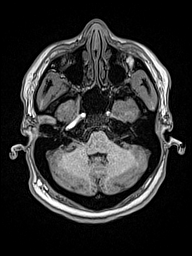
[im 63/174]
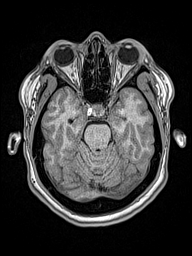
[im 79/174]
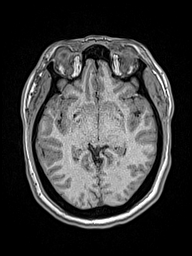
[im 95/174]
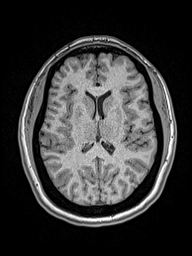
[im 111/174]
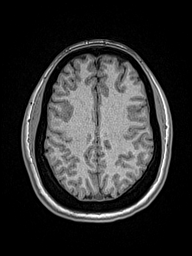
[im 126/174]
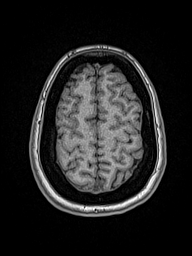
[im 142/174]
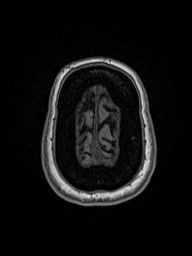
[im 158/174]
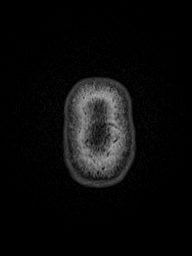
[im 174/174]
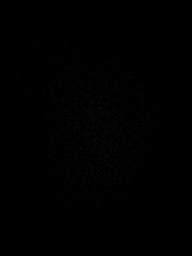

[Series 17: T2 · coronal · 5.0mm · 0.57mm/px · 2 of 29 slices shown (2 of 2)]
[im 1/29]
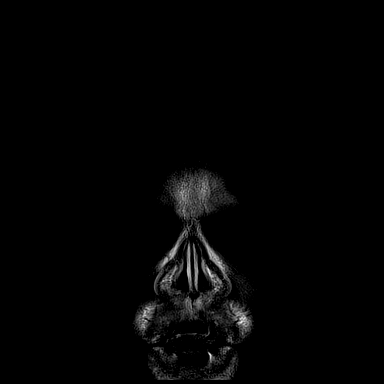
[im 29/29]
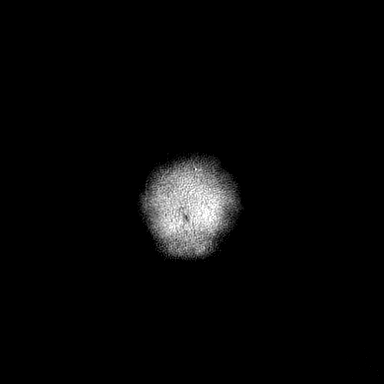

[48 of 48 positions shown; findings below may reference images not displayed]

FINDINGS: Brain: There is no acute infarction or intracranial hemorrhage.
There is no intracranial mass, mass effect, or edema. There is no
hydrocephalus or extra-axial fluid collection. Ventricles and sulci
are normal in size and configuration.

Vascular: Major vessel flow voids at the skull base are preserved.

Skull and upper cervical spine: Normal marrow signal is preserved.

Sinuses/Orbits: Trace mucosal thickening.  Orbits are unremarkable.

Other: Sella is unremarkable.  Mastoid air cells are clear.
IMPRESSION: No acute infarct, hemorrhage, or mass.

## 2024-05-19 ENCOUNTER — Other Ambulatory Visit: Payer: Self-pay | Admitting: Family Medicine

## 2024-05-19 DIAGNOSIS — Z1231 Encounter for screening mammogram for malignant neoplasm of breast: Secondary | ICD-10-CM

## 2024-06-03 ENCOUNTER — Ambulatory Visit
Admission: RE | Admit: 2024-06-03 | Discharge: 2024-06-03 | Disposition: A | Discharge status: Home / Self Care | Payer: Self-pay | Source: Ambulatory Visit | Attending: Family Medicine | Admitting: Family Medicine

## 2024-06-03 DIAGNOSIS — Z1231 Encounter for screening mammogram for malignant neoplasm of breast: Secondary | ICD-10-CM | POA: Insufficient documentation
